# Patient Record
Sex: Female | Born: 1995 | Hispanic: Yes | Marital: Married | Smoking: Former smoker
Health system: Southern US, Community
[De-identification: ages and names within clinical notes are randomized; demographics above are authoritative.]

## PROBLEM LIST (undated history)

## (undated) DIAGNOSIS — E079 Disorder of thyroid, unspecified: Secondary | ICD-10-CM

## (undated) DIAGNOSIS — E785 Hyperlipidemia, unspecified: Secondary | ICD-10-CM

## (undated) DIAGNOSIS — J45909 Unspecified asthma, uncomplicated: Secondary | ICD-10-CM

## (undated) HISTORY — DX: Hyperlipidemia, unspecified: E78.5

## (undated) HISTORY — DX: Disorder of thyroid, unspecified: E07.9

## (undated) HISTORY — DX: Unspecified asthma, uncomplicated: J45.909

---

## 2021-02-01 DIAGNOSIS — J020 Streptococcal pharyngitis: Secondary | ICD-10-CM | POA: Diagnosis not present

## 2021-02-01 DIAGNOSIS — R112 Nausea with vomiting, unspecified: Secondary | ICD-10-CM | POA: Diagnosis not present

## 2021-02-01 DIAGNOSIS — R509 Fever, unspecified: Secondary | ICD-10-CM | POA: Diagnosis not present

## 2021-02-01 DIAGNOSIS — K591 Functional diarrhea: Secondary | ICD-10-CM | POA: Diagnosis not present

## 2021-03-22 ENCOUNTER — Telehealth: Payer: Self-pay | Admitting: Physician Assistant

## 2021-03-22 ENCOUNTER — Other Ambulatory Visit: Payer: Self-pay

## 2021-03-22 ENCOUNTER — Ambulatory Visit (INDEPENDENT_AMBULATORY_CARE_PROVIDER_SITE_OTHER): Payer: Medicaid Other | Admitting: Physician Assistant

## 2021-03-22 ENCOUNTER — Encounter: Payer: Self-pay | Admitting: Physician Assistant

## 2021-03-22 VITALS — BP 114/74 | HR 64 | Temp 97.6°F | Ht 64.0 in | Wt 185.0 lb

## 2021-03-22 DIAGNOSIS — Z7689 Persons encountering health services in other specified circumstances: Secondary | ICD-10-CM | POA: Diagnosis not present

## 2021-03-22 DIAGNOSIS — R194 Change in bowel habit: Secondary | ICD-10-CM | POA: Diagnosis not present

## 2021-03-22 DIAGNOSIS — Z1321 Encounter for screening for nutritional disorder: Secondary | ICD-10-CM

## 2021-03-22 DIAGNOSIS — Z23 Encounter for immunization: Secondary | ICD-10-CM | POA: Diagnosis not present

## 2021-03-22 DIAGNOSIS — R1084 Generalized abdominal pain: Secondary | ICD-10-CM | POA: Diagnosis not present

## 2021-03-22 DIAGNOSIS — R112 Nausea with vomiting, unspecified: Secondary | ICD-10-CM | POA: Diagnosis not present

## 2021-03-22 DIAGNOSIS — Z01419 Encounter for gynecological examination (general) (routine) without abnormal findings: Secondary | ICD-10-CM | POA: Diagnosis not present

## 2021-03-22 DIAGNOSIS — Z130 Encounter for screening for diseases of the blood and blood-forming organs and certain disorders involving the immune mechanism: Secondary | ICD-10-CM

## 2021-03-22 MED ORDER — DICYCLOMINE HCL 10 MG PO CAPS: 10.0000 mg | ORAL_CAPSULE | Freq: Three times a day (TID) | ORAL | 0 refills | Status: DC | PRN

## 2021-03-22 NOTE — Progress Notes (Signed)
New Patient Office Visit  Subjective:  Patient ID: Tracie Tapia, female    DOB: 06-06-1995  Age: 26 y.o. MRN: [id number]  CC:  Chief Complaint  Patient presents with   New Patient (Initial Visit)    HPI Tracie Tapia presents to establish care. Patient reports it has been about 5 years since she has been to a PCP. Patient has a past medical hx of hyperlipidemia and thyroid disease. States unsure about what type of thyroid disease. Does not take prescribed medications or supplements. Patient has c/o abdominal pain which radiates to her back x 2 years which has gradually worsened the past few months. No specific location for pain, states is all over. Pain comes and goes. Reports pain varies from 8-10 on a scale of 0-10. Sometimes feels bloated and also has nausea and vomiting which she describes as yellow which looks like bile. Denies constipation. Lately has been having more diarrhea with loose stools and decreased appetite. No fever,bloody stools, dysuria, hematuria, unintentional weight loss, chills or night sweats.  Past Medical History:  Diagnosis Date   Hyperlipidemia    Thyroid disease     Past Surgical History:  Procedure Laterality Date   CESAREAN SECTION  2017    Family History  Problem Relation Age of Onset   Cancer Mother        Ovarian, Breast   Hypertension Mother    Hyperlipidemia Mother    Diabetes Mother    Hypertension Father    Hyperlipidemia Father    Diabetes Father    Depression Maternal Grandmother    Hypertension Maternal Grandmother    Hyperlipidemia Maternal Grandmother    Diabetes Maternal Grandmother    Cancer Maternal Grandmother        Ovarian, Breast   Hypertension Maternal Grandfather    Hyperlipidemia Maternal Grandfather    Diabetes Maternal Grandfather    Hypertension Paternal Grandmother    Hyperlipidemia Paternal Grandmother    Diabetes Paternal Grandmother    Heart disease Paternal Grandmother    Hypertension  Paternal Grandfather    Hyperlipidemia Paternal Grandfather    Diabetes Paternal Grandfather     Social History   Socioeconomic History   Marital status: Married    Spouse name: Not on file   Number of children: 1   Years of education: Not on file   Highest education level: Not on file  Occupational History   Not on file  Tobacco Use   Smoking status: Former    Packs/day: 2.00    Years: 1.00    Pack years: 2.00    Types: Cigarettes    Quit date: 2020    Years since quitting: 3.0   Smokeless tobacco: Never  Substance and Sexual Activity   Alcohol use: Not Currently   Drug use: Not Currently   Sexual activity: Yes    Partners: Male  Other Topics Concern   Not on file  Social History Narrative   Not on file   Social Determinants of Health   Financial Resource Strain: Not on file  Food Insecurity: Not on file  Transportation Needs: Not on file  Physical Activity: Not on file  Stress: Not on file  Social Connections: Not on file  Intimate Partner Violence: Not on file    ROS Review of Systems Review of Systems:  A fourteen system review of systems was performed and found to be positive as per HPI.  Objective:   Today's Vitals: BP 114/74    Pulse 64  Temp 97.6 F (36.4 C)    Ht 5\' 4"  (1.626 m)    Wt 185 lb (83.9 kg)    SpO2 100%    BMI 31.76 kg/m   Physical Exam General:  Well Developed, well nourished, appropriate for stated age.  Neuro:  Alert and oriented,  extra-ocular muscles intact  HEENT:  Normocephalic, atraumatic, neck supple  Skin:  no gross rash, warm, pink. Abdomen: +BS, non-distended, +generalized tenderness with prominent tenderness of RUQ and LLQ, negative Rovsing's and McBurney's signs, no guarding or rebound tenderness, no bruit appreciated  Cardiac:  RRR, S1 S2 Respiratory: CTA B/L  Vascular:  Ext warm, no cyanosis apprec.; cap RF less 2 sec. Psych:  No HI/SI, judgement and insight good, Euthymic mood. Full Affect.  Assessment & Plan:    Problem List Items Addressed This Visit   None Visit Diagnoses     Encounter to establish care    -  Primary   Generalized abdominal pain       Relevant Medications   dicyclomine (BENTYL) 10 MG capsule   Nausea and vomiting, unspecified vomiting type       Relevant Orders   CT Abdomen Pelvis W Contrast   Need for influenza vaccination       Relevant Orders   Flu Vaccine QUAD 75mo+IM (Fluarix, Fluzone & Alfiuria Quad PF) (Completed)   Women's annual routine gynecological examination       Relevant Orders   Ambulatory referral to Obstetrics / Gynecology   Change in bowel habit          Encounter to establish care: -Will place referral to OB/GYN for routine female exam with gynecological exam per patient request. -Recommend to schedule CPE in the near future. -Will collect routine fasting labs tomorrow morning.  Generalized abdominal pain: -Etiology unclear. DDX: gallbladder disease, pancreatitis, colitis, gastroenteritis, IBD, diverticulitis, IBS. Will place order for abdomen pelvis CT for further evaluation. Will trial dicyclomine 10 mg TID prn. Pending imaging studies, recommend referral to gastroenterology. Patient prefers to obtain imaging before proceeding with referral. Patient returning tomorrow morning for FBW and will evaluate for leukocytosis, signs of pancreatitis or gallbladder or hematologic etiology. Will reassess symptoms and medication therapy in 1 week.  Outpatient Encounter Medications as of 03/22/2021  Medication Sig   dicyclomine (BENTYL) 10 MG capsule Take 1 capsule (10 mg total) by mouth 3 (three) times daily as needed for spasms.   No facility-administered encounter medications on file as of 03/22/2021.    Follow-up: Return in about 1 week (around 03/29/2021) for abd pain; lab visit for tomorrow monring FBW include lipase .   Lorrene Reid, PA-C

## 2021-03-22 NOTE — Telephone Encounter (Signed)
PA has been denied. 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 Please contact [facility] at [phone].  CPT 74177 DX R11.2

## 2021-03-22 NOTE — Patient Instructions (Addendum)
Dolor abdominal en los adultos Abdominal Pain, Adult El dolor de estmago (abdominal) puede tener muchas causas. La mayora de las veces, el dolor de estmago no es peligroso. Muchos de estos casos de dolor de estmago pueden controlarse y tratarse en casa. Sin embargo, a veces, el dolor de estmago es grave. Elmdico intentar descubrir la causa del dolor de estmago. Siga estas instrucciones en su casa:  Medicamentos Tome los medicamentos de venta libre y los recetados solamente como se lo haya indicado el mdico. No tome medicamentos que lo ayuden a defecar (laxantes), salvo que el mdico se lo indique. Instrucciones generales Est atento al dolor de estmago para detectar cualquier cambio. Beba suficiente lquido para mantener el pis (la orina) de color amarillo plido. Concurra a todas las visitas de seguimiento como se lo haya indicado el mdico. Esto es importante. Comunquese con un mdico si: El dolor de estmago cambia o empeora. No tiene apetito o baja de peso sin proponrselo. Tiene dificultades para defecar (est estreido) o heces lquidas (diarrea) durante ms de 2 o 3 das. Siente dolor al orinar o defecar. El dolor de estmago lo despierta de noche. El dolor empeora con las comidas, despus de comer o con determinados alimentos. Tiene vmitos y no puede retener nada de lo que ingiere. Tiene fiebre. Observa sangre en la orina. Solicite ayuda de inmediato si: El dolor no desaparece en el tiempo indicado por el mdico. No puede dejar de vomitar. Siente dolor solamente en zonas especficas del abdomen, como el lado derecho o la parte inferior izquierda. Tiene heces con sangre, de color negro o con aspecto alquitranado. Tiene dolor muy intenso en el vientre, clicos o meteorismo. Presenta signos de no tener suficientes lquidos o agua en el cuerpo (deshidratacin), por ejemplo: Orina oscura, muy escasa o falta de orina. Labios agrietados. Sequedad de boca. Ojos  hundidos. Somnolencia. Debilidad. Tiene dificultad para respirar o dolor en el pecho. Resumen Muchos de estos casos de dolor de estmago pueden controlarse y tratarse en casa. Est atento al dolor de estmago para detectar cualquier cambio. Tome los medicamentos de venta libre y los recetados solamente como se lo haya indicado el mdico. Comunquese con un mdico si el dolor de estmago cambia o empeora. Busque ayuda de inmediato si tiene dolor muy intenso en el vientre, clicos o meteorismo. Esta informacin no tiene como fin reemplazar el consejo del mdico. Asegresede hacerle al mdico cualquier pregunta que tenga. Document Revised: 08/14/2018 Document Reviewed: 08/14/2018 Elsevier Patient Education  2022 Elsevier Inc.  

## 2021-03-23 ENCOUNTER — Other Ambulatory Visit: Payer: Medicaid Other

## 2021-03-23 DIAGNOSIS — Z1329 Encounter for screening for other suspected endocrine disorder: Secondary | ICD-10-CM | POA: Diagnosis not present

## 2021-03-23 DIAGNOSIS — Z130 Encounter for screening for diseases of the blood and blood-forming organs and certain disorders involving the immune mechanism: Secondary | ICD-10-CM

## 2021-03-23 DIAGNOSIS — R112 Nausea with vomiting, unspecified: Secondary | ICD-10-CM

## 2021-03-23 DIAGNOSIS — Z13228 Encounter for screening for other metabolic disorders: Secondary | ICD-10-CM | POA: Diagnosis not present

## 2021-03-23 DIAGNOSIS — R1084 Generalized abdominal pain: Secondary | ICD-10-CM | POA: Diagnosis not present

## 2021-03-23 DIAGNOSIS — Z1321 Encounter for screening for nutritional disorder: Secondary | ICD-10-CM | POA: Diagnosis not present

## 2021-03-23 NOTE — Progress Notes (Signed)
Established Patient Office Visit  Subjective:  Patient ID: Tracie Tapia, female    DOB: [birth year]  Age: 26 y.o. MRN: [id number]  CC:  Chief Complaint  Patient presents with   Abdominal Pain    HPI [facility] presents for follow up on abdominal pain. Patient reports Bentyl has helped with the abdominal pain and states her nausea has also improved. Does states after having a meal will have a bowel movement. Patient c/o of generalized itching all over her body which happens suddenly. Denies new detergents, soaps, facial or tongue swelling, shortness of breath or wheezing. Also states has chronic back pain which can be triggered by prolonged sitting. No bowel or bladder dysfunction. Takes acetaminophen when needed for pain relief which is not always effective. 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 Patient reports remote hx of asthma and when needed uses saline solution for nebulizer treatment, tries to limit using albuterol. Has ventolin inhaler which she has not used recently.   Past Medical History:  Diagnosis Date   Hyperlipidemia    Thyroid disease     Past Surgical History:  Procedure Laterality Date   CESAREAN SECTION  2017    Family History  Problem Relation Age of Onset   Cancer Mother        Ovarian, Breast   Hypertension Mother    Hyperlipidemia Mother    Diabetes Mother    Hypertension Father    Hyperlipidemia Father    Diabetes Father    Depression Maternal Grandmother    Hypertension Maternal Grandmother    Hyperlipidemia Maternal Grandmother    Diabetes Maternal Grandmother    Cancer Maternal Grandmother        Ovarian, Breast   Hypertension Maternal Grandfather    Hyperlipidemia Maternal Grandfather    Diabetes Maternal Grandfather    Hypertension Paternal Grandmother    Hyperlipidemia Paternal Grandmother    Diabetes Paternal Grandmother    Heart disease Paternal Grandmother    Hypertension  Paternal Grandfather    Hyperlipidemia Paternal Grandfather    Diabetes Paternal Grandfather     Social History   Socioeconomic History   Marital status: Married    Spouse name: Not on file   Number of children: 1   Years of education: Not on file   Highest education level: Not on file  Occupational History   Not on file  Tobacco Use   Smoking status: Former    Packs/day: 2.00    Years: 1.00    Pack years: 2.00    Types: Cigarettes    Quit date: 2020    Years since quitting: 3.1   Smokeless tobacco: Never  Substance and Sexual Activity   Alcohol use: Not Currently   Drug use: Not Currently   Sexual activity: Yes    Partners: Male  Other Topics Concern   Not on file  Social History Narrative   Not on file   Social Determinants of Health   Financial Resource Strain: Not on file  Food Insecurity: Not on file  Transportation Needs: Not on file  Physical Activity: Not on file  Stress: Not on file  Social Connections: Not on file  Intimate Partner Violence: Not on file    Outpatient Medications Prior to Visit  Medication Sig Dispense Refill   dicyclomine (BENTYL) 10 MG capsule Take 1 capsule (10 mg total) by mouth 3 (three) times daily as needed for spasms. 30 capsule 0   No facility-administered  medications prior to visit.    No Known Allergies  ROS Review of Systems Review of Systems:  A fourteen system review of systems was performed and found to be positive as per HPI.    Objective:    Physical Exam General:  Well Developed, well nourished, in no acute distress  Neuro:  Alert and oriented,  extra-ocular muscles intact  HEENT:  Normocephalic, atraumatic, neck supple  Skin:  no rash, warm, pink. Cardiac:  RRR, S1 S2 Abdomen: +BS, soft, non-distended, mild tenderness of upper and lower abdomen, no rebound tenderness  Respiratory: CTA B/L w/o wheezing, rhonchi, or crackles.  MSK: tenderness of midline thoracic spine, muscle spasticity of mid-lower back  mostly right sided noted, no step-off or deformity, full ROM Vascular:  Ext warm, no cyanosis apprec.; cap RF less 2 sec. Psych:  No HI/SI, judgement and insight good, Euthymic mood. Full Affect.  BP 108/69    Pulse (!) 57    Temp 98 F (36.7 C)    Ht _0  (1.626 m)    Wt 182 lb (82.6 kg)    SpO2 98%    BMI 31.24 kg/m  Wt Readings from Last 3 Encounters:  03/29/21 182 lb (82.6 kg)  03/22/21 185 lb (83.9 kg)     Health Maintenance Due  Topic Date Due   COVID-19 Vaccine (1) Never done   HPV VACCINES (1 - 2-dose series) Never done   HIV Screening  Never done   Hepatitis C Screening  Never done   TETANUS/TDAP  Never done   PAP-Cervical Cytology Screening  Never done   PAP SMEAR-Modifier  Never done       Topic Date Due   HPV VACCINES (1 - 2-dose series) Never done    Lab Results  Component Value Date   TSH 2.070 03/23/2021   Lab Results  Component Value Date   WBC 7.8 03/23/2021   HGB 13.3 03/23/2021   HCT 40.5 03/23/2021   MCV 83 03/23/2021   PLT 356 03/23/2021   Lab Results  Component Value Date   NA 138 03/23/2021   K 4.5 03/23/2021   CO2 22 03/23/2021   GLUCOSE 85 03/23/2021   BUN 9 03/23/2021   CREATININE 0.73 03/23/2021   BILITOT 0.3 03/23/2021   ALKPHOS 86 03/23/2021   AST 23 03/23/2021   ALT 30 03/23/2021   PROT 7.4 03/23/2021   ALBUMIN 4.5 03/23/2021   CALCIUM 9.7 03/23/2021   EGFR 116 03/23/2021   Lab Results  Component Value Date   CHOL 267 (H) 03/23/2021   Lab Results  Component Value Date   HDL 49 03/23/2021   Lab Results  Component Value Date   LDLCALC 187 (H) 03/23/2021   Lab Results  Component Value Date   TRIG 165 (H) 03/23/2021   Lab Results  Component Value Date   CHOLHDL 5.4 (H) 03/23/2021   Lab Results  Component Value Date   HGBA1C 5.8 (H) 03/23/2021      Assessment & Plan:   Problem List Items Addressed This Visit   None Visit Diagnoses     Pruritus    -  Primary   Relevant Orders   Bile acids, total    Generalized abdominal pain       Chronic bilateral thoracic back pain       Relevant Medications   methocarbamol (ROBAXIN) 500 MG tablet   Mild intermittent asthma without complication       Relevant Medications   sodium chloride 0.9 %  nebulizer solution   albuterol (VENTOLIN HFA) 108 (90 Base) MCG/ACT inhaler      Generalized abdominal pain: -Improved. Patient is scheduled for abdomen pelvis CT w/ contrast 04/18/2021. -Recommend to continue dicyclomine 10 mg TID as needed. Pending CT results will determine next appropriate steps.  Pruritus: -Discussed with patient various etiologies. Will collect lab to evaluate for signs of cholestasis contributing to symptoms. Recent CBC, TSH and CMP normal. Advised patient to trial oral antihistamine such as Zytec and/or apply Sarna lotion.   Chronic bilateral thoracic back pain: -Discussed likely MSK etiology. Recommend to apply heat therapy, gentle back stretches and exercises, and can take muscle relaxer when needed for significant pain.   Mild intermittent asthma without complication: -Stable. No adventitious sounds on exam. -Use rescue inhaler when needed and nebulizer treatment.  Meds ordered this encounter  Medications   methocarbamol (ROBAXIN) 500 MG tablet    Sig: Take 1 tablet (500 mg total) by mouth every 8 (eight) hours as needed for muscle spasms.    Dispense:  30 tablet    Refill:  0    Order Specific Question:   Supervising Provider    Answer:   Beatrice Lecher D [[id number]]   sodium chloride 0.9 % nebulizer solution    Sig: Take 3 mLs by nebulization as needed for wheezing.    Dispense:  90 mL    Refill:  1    Order Specific Question:   Supervising Provider    Answer:   Beatrice Lecher D [[id number]]   albuterol (VENTOLIN HFA) 108 (90 Base) MCG/ACT inhaler    Sig: Inhale 2 puffs into the lungs every 6 (six) hours as needed for wheezing or shortness of breath.    Dispense:  8 g    Refill:  2    Order Specific Question:    Supervising Provider    Answer:   Beatrice Lecher D [[id number]]    Follow-up: Return in about 3 months (around 06/26/2021) for CPE with FBW few days before.    Lorrene Reid, PA-C

## 2021-03-24 LAB — COMPREHENSIVE METABOLIC PANEL
ALT: 30 IU/L (ref 0–32)
AST: 23 IU/L (ref 0–40)
Albumin/Globulin Ratio: 1.6 (ref 1.2–2.2)
Albumin: 4.5 g/dL (ref 3.9–5.0)
Alkaline Phosphatase: 86 IU/L (ref 44–121)
BUN/Creatinine Ratio: 12 (ref 9–23)
BUN: 9 mg/dL (ref 6–20)
Bilirubin Total: 0.3 mg/dL (ref 0.0–1.2)
CO2: 22 mmol/L (ref 20–29)
Calcium: 9.7 mg/dL (ref 8.7–10.2)
Chloride: 102 mmol/L (ref 96–106)
Creatinine, Ser: 0.73 mg/dL (ref 0.57–1.00)
Globulin, Total: 2.9 g/dL (ref 1.5–4.5)
Glucose: 85 mg/dL (ref 70–99)
Potassium: 4.5 mmol/L (ref 3.5–5.2)
Sodium: 138 mmol/L (ref 134–144)
Total Protein: 7.4 g/dL (ref 6.0–8.5)
eGFR: 116 mL/min/{1.73_m2} (ref >59)

## 2021-03-24 LAB — LIPID PANEL
Chol/HDL Ratio: 5.4 ratio — ABNORMAL HIGH (ref 0.0–4.4)
Cholesterol, Total: 267 mg/dL — ABNORMAL HIGH (ref 100–199)
HDL: 49 mg/dL (ref >39)
LDL Chol Calc (NIH): 187 mg/dL — ABNORMAL HIGH (ref 0–99)
Triglycerides: 165 mg/dL — ABNORMAL HIGH (ref 0–149)
VLDL Cholesterol Cal: 31 mg/dL (ref 5–40)

## 2021-03-24 LAB — CBC WITH DIFFERENTIAL/PLATELET
Basophils Absolute: 0.1 10*3/uL (ref 0.0–0.2)
Basos: 1 %
EOS (ABSOLUTE): 0.1 10*3/uL (ref 0.0–0.4)
Eos: 2 %
Hematocrit: 40.5 % (ref 34.0–46.6)
Hemoglobin: 13.3 g/dL (ref 11.1–15.9)
Immature Grans (Abs): 0.1 10*3/uL (ref 0.0–0.1)
Immature Granulocytes: 1 %
Lymphocytes Absolute: 2.3 10*3/uL (ref 0.7–3.1)
Lymphs: 30 %
MCH: 27.3 pg (ref 26.6–33.0)
MCHC: 32.8 g/dL (ref 31.5–35.7)
MCV: 83 fL (ref 79–97)
Monocytes Absolute: 0.5 10*3/uL (ref 0.1–0.9)
Monocytes: 6 %
Neutrophils Absolute: 4.8 10*3/uL (ref 1.4–7.0)
Neutrophils: 60 %
Platelets: 356 10*3/uL (ref 150–450)
RBC: 4.87 x10E6/uL (ref 3.77–5.28)
RDW: 13.1 % (ref 11.7–15.4)
WBC: 7.8 10*3/uL (ref 3.4–10.8)

## 2021-03-24 LAB — HEMOGLOBIN A1C
Est. average glucose Bld gHb Est-mCnc: 120 mg/dL
Hgb A1c MFr Bld: 5.8 % — ABNORMAL HIGH (ref 4.8–5.6)

## 2021-03-24 LAB — TSH: TSH: 2.07 u[IU]/mL (ref 0.450–4.500)

## 2021-03-24 LAB — LIPASE: Lipase: 19 U/L (ref 14–72)

## 2021-03-29 ENCOUNTER — Encounter: Payer: Self-pay | Admitting: Physician Assistant

## 2021-03-29 ENCOUNTER — Other Ambulatory Visit: Payer: Self-pay

## 2021-03-29 ENCOUNTER — Ambulatory Visit (INDEPENDENT_AMBULATORY_CARE_PROVIDER_SITE_OTHER): Payer: Medicaid Other | Admitting: Physician Assistant

## 2021-03-29 VITALS — BP 108/69 | HR 57 | Temp 98.0°F | Ht 64.0 in | Wt 182.0 lb

## 2021-03-29 DIAGNOSIS — R1084 Generalized abdominal pain: Secondary | ICD-10-CM

## 2021-03-29 DIAGNOSIS — J4520 Mild intermittent asthma, uncomplicated: Secondary | ICD-10-CM

## 2021-03-29 DIAGNOSIS — M546 Pain in thoracic spine: Secondary | ICD-10-CM | POA: Diagnosis not present

## 2021-03-29 DIAGNOSIS — L299 Pruritus, unspecified: Secondary | ICD-10-CM | POA: Diagnosis not present

## 2021-03-29 DIAGNOSIS — G8929 Other chronic pain: Secondary | ICD-10-CM | POA: Diagnosis not present

## 2021-03-29 MED ORDER — ALBUTEROL SULFATE HFA 108 (90 BASE) MCG/ACT IN AERS
2.0000 | INHALATION_SPRAY | Freq: Four times a day (QID) | RESPIRATORY_TRACT | 2 refills | Status: DC | PRN
Start: 2021-03-29 — End: 2021-05-16

## 2021-03-29 MED ORDER — SODIUM CHLORIDE 0.9 % IN NEBU: 3.0000 mL | INHALATION_SOLUTION | RESPIRATORY_TRACT | 1 refills | Status: DC | PRN

## 2021-03-29 MED ORDER — METHOCARBAMOL 500 MG PO TABS: 500.0000 mg | ORAL_TABLET | Freq: Three times a day (TID) | ORAL | 0 refills | Status: DC | PRN

## 2021-03-29 NOTE — Patient Instructions (Addendum)
Prurito Pruritus El prurito es una sensacin de picazn en la piel. Una de las causas ms frecuentes es la sequedad de la piel, pero muchas cosas diferentes pueden causar picazn. La mayora de las causas de picazn no requieren [place]. A veces, la picazn en la piel puede convertirse en una erupcin cutnea. Siga estas indicaciones en su casa: Cuidado de la piel  Aplquese una locin humectante sobre la piel segn sea necesario. Las lociones que contienen vaselina son las mejores. Tome los medicamentos o aplquese las cremas con medicamento solamente como se lo haya indicado el mdico. Esto puede incluir lo siguiente: Crema con corticosteroides. Lociones para Associate Professor. Antihistamnicos por va oral. Aplique un pao fro y hmedo (compresa fra) en la zona afectada. Tome baos de inmersin con uno de los siguientes: Airline pilot de [place]. Puede conseguirlas en la tienda de comestibles o la farmacia local. Siga las instrucciones del envase. Bicarbonato de sodio. Vierta un poco en la baera como se lo haya indicado el mdico. Avena coloidal. Puede conseguirla en la tienda de comestibles o la farmacia local. Siga las instrucciones del envase. Aplquese una pasta de bicarbonato de [employer]. Para prepararla, mezcle un poco de agua con una pequea cantidad de bicarbonato de sodio hasta alcanzar la consistencia de una pasta. No se rasque la piel. No tome baos de inmersin ni duchas calientes, ya que pueden empeorar la picazn. Shaune Spittle de agua fra puede ayudar a Associate Professor, siempre y cuando use un humectante despus de la ducha. No utilice jabones perfumados, detergentes, perfumes ni cosmticos. En lugar de eso, utilice versiones suaves y sin perfume de estos productos. Instrucciones generales Evite usar ropa ajustada. Lleve un diario como ayuda para determinar la causa de la picazn. Escriba los siguientes datos: Lo que usted come y [place]. Los cosmticos que  [place]. Qu detergentes o [employer]. Lo que lleva puesto, incluidas las joyas. Use un humidificador. Este [employer] humedad del aire, lo que ayuda a Chartered certified accountant. Preste atencin a cualquier Advertising account planner. Comunquese con un mdico si: La picazn no desaparece despus de [employer]. Tiene una sed inusual u orina ms de lo normal. Siente hormigueo o adormecimiento en la piel. Nota que la piel o la zona blanca de los ojos estn amarillas (ictericia). Se siente dbil. Tiene alguno de los siguientes sntomas: Sudoracin nocturna. Cansancio (fatiga). Prdida de peso. Dolor abdominal. Resumen El prurito es una sensacin de picazn en la piel. Una de las causas ms frecuentes es la sequedad de la piel, pero muchas afecciones y factores diferentes pueden causar picazn. Aplquese una locin humectante sobre la piel segn sea necesario. Las lociones que contienen vaselina son las mejores. Tome los medicamentos o aplquese las cremas con medicamento solamente como se lo haya indicado el mdico. No se duche ni tome baos de inmersin con agua caliente. No utilice jabones perfumados, detergentes, perfumes ni cosmticos. Esta informacin no tiene Theme park manager el consejo del mdico. Asegrese de hacerle al mdico cualquier pregunta que tenga. Document Revised: 04/18/2017 Document Reviewed: 04/18/2017 Elsevier Patient Education  2022 [employer].

## 2021-03-30 LAB — BILE ACIDS, TOTAL: Bile Acids Total: 1.6 umol/L (ref 0.0–10.0)

## 2021-04-18 ENCOUNTER — Ambulatory Visit
Admission: RE | Admit: 2021-04-18 | Discharge: 2021-04-18 | Disposition: A | Discharge status: Home / Self Care | Payer: Medicaid Other | Source: Ambulatory Visit | Attending: Physician Assistant | Admitting: Physician Assistant

## 2021-04-18 ENCOUNTER — Other Ambulatory Visit: Payer: Self-pay

## 2021-04-18 DIAGNOSIS — R1084 Generalized abdominal pain: Secondary | ICD-10-CM | POA: Diagnosis not present

## 2021-04-18 DIAGNOSIS — R112 Nausea with vomiting, unspecified: Secondary | ICD-10-CM | POA: Diagnosis not present

## 2021-04-18 MED ORDER — IOPAMIDOL (ISOVUE-300) INJECTION 61%
100.0000 mL | Freq: Once | INTRAVENOUS | Status: AC | PRN
  Administered 2021-04-18: 100 mL via INTRAVENOUS

## 2021-05-02 ENCOUNTER — Other Ambulatory Visit: Payer: Self-pay

## 2021-05-02 DIAGNOSIS — G8929 Other chronic pain: Secondary | ICD-10-CM

## 2021-05-02 DIAGNOSIS — R1084 Generalized abdominal pain: Secondary | ICD-10-CM

## 2021-05-02 MED ORDER — METHOCARBAMOL 500 MG PO TABS: 500.0000 mg | ORAL_TABLET | Freq: Three times a day (TID) | ORAL | 2 refills | Status: DC | PRN

## 2021-05-02 MED ORDER — DICYCLOMINE HCL 10 MG PO CAPS: 10.0000 mg | ORAL_CAPSULE | Freq: Three times a day (TID) | ORAL | 1 refills | Status: AC | PRN

## 2021-05-09 ENCOUNTER — Encounter: Payer: Self-pay | Admitting: Physician Assistant

## 2021-05-16 ENCOUNTER — Encounter: Payer: Self-pay | Admitting: Nurse Practitioner

## 2021-05-16 ENCOUNTER — Ambulatory Visit: Payer: Medicaid Other | Admitting: Nurse Practitioner

## 2021-05-16 ENCOUNTER — Other Ambulatory Visit: Payer: Medicaid Other

## 2021-05-16 VITALS — BP 102/60 | HR 61 | Ht 64.0 in | Wt 183.0 lb

## 2021-05-16 DIAGNOSIS — R197 Diarrhea, unspecified: Secondary | ICD-10-CM

## 2021-05-16 DIAGNOSIS — R1011 Right upper quadrant pain: Secondary | ICD-10-CM

## 2021-05-16 DIAGNOSIS — R112 Nausea with vomiting, unspecified: Secondary | ICD-10-CM | POA: Diagnosis not present

## 2021-05-16 NOTE — Progress Notes (Signed)
I agree with the above note, plan 

## 2021-05-16 NOTE — Patient Instructions (Signed)
Your provider has requested that you go to the basement level for lab work before leaving today. Press "B" on the elevator. The lab is located at the first door on the left as you exit the elevator. ? ? ?You have been scheduled for an abdominal ultrasound at [facility] (1st floor of hospital) on 05/20/21 at 9:00am. Please arrive 15 minutes prior to your appointment for registration. Make certain not to have anything to eat or drink 6 hours prior to your appointment. Should you need to reschedule your appointment, please contact radiology at [phone]. This test typically takes about 30 minutes to perform. ? ?We will call you with test result and what the next step will be.  ? ?Thank you for choosing me and [facility]. ? ?Tye Savoy NP  ? ?

## 2021-05-16 NOTE — Progress Notes (Signed)
? ? ?ASSESSMENT :   ? ?Patient profile:  ?Tracie Tapia is a 26 y.o. female  who speaks limited English and is new to practice. She is here with an interpreter. She has a past medical history significant for HLD, thyroid disease ( per patient but not on treatment),  obesity, DM ( diet controlled)  See PMH below for any additional history. ? ?# Two month history of progressive, nearly constant RUQ pain radiating through to back and up into right shoulder blade. Also with daily vomiting, abdominal bloating and multiple episodes of daily diarrhea.  CBC, CMET, lipase normal. CTAP w/ contrast is unremarkable. All of her symptoms are not necessarily related. Need to evaluate for gallbladder disease / PUD. Regarding diarrhea, rule out infection ( probably not), IBD. IBS- D possible.  ? ?PLAN:    ? ?RUQ [place] for RUQ pain / nausea and vomiting ?Stool GI path panel  ?Will contact her with results, if negative then likely proceed with EGD and colonoscopy.  ?Can continue Bentyl as needed but seems to be of only limited benefit   ? ?History of Present Illness:  ? ?Chief complaint:  abdominal pain, nausea, vomiting and diarrhea.  Abnormal CT scan ? ?Patient established care with PCP the end of January at which time she was having abdominal pain. She gives a history of burning and punching type RUQ pain since November. Pain radiates through to her back, into right shoulder blade. Pain can be exacerbated by bending / twisting or laying on right side. None of her symptoms are related to eating.   Pain initially intermittent ( 2-3 times a week).but constant for last two months. Pain wakes her up at night. Bentyl help a little.  Also having abdominal bloating, vomiting ( bilious emesis) and diarrhea for two months. Having 6-7 watery, non-bloody BMs a day. No solid stools since onset of symptoms 2 months ago. She bruises easy and noticed bruising or upper abdomen / RUQ areas where she has been having the pain.  he has  unintentionally lost 5 pounds. No antibiotics in last few months. No diet changes to attribute symptoms to. PCP advised her to reduce starch intake and sugars to help with diabetes but she hasn't seen any improvement in GI symptoms. Right now she is still managing diabetes by diet. Hgb A1c < 6 ? ?Has Robaxin on med list, says it was prescribed for the right back pain . It helps her sleep through the pain ? ?Weight 185 pounds 03/23/19, 183 pounds today ? ?No known [place] of GI disease.  ? ?Previous Labs / Imaging:: ? ?  Latest Ref Rng & Units 03/23/2021  ?  9:09 AM  ?CBC  ?WBC 3.4 - 10.8 x10E3/uL 7.8    ?Hemoglobin 11.1 - 15.9 g/dL 13.3    ?Hematocrit 34.0 - 46.6 % 40.5    ?Platelets 150 - 450 x10E3/uL 356    ? ? ?Lab Results  ?Component Value Date  ? LIPASE 19 03/23/2021  ? ? ?  Latest Ref Rng & Units 03/23/2021  ?  9:09 AM  ?CMP  ?Glucose 70 - 99 mg/dL 85    ?BUN 6 - 20 mg/dL 9    ?Creatinine 0.57 - 1.00 mg/dL 0.73    ?Sodium 134 - 144 mmol/L 138    ?Potassium 3.5 - 5.2 mmol/L 4.5    ?Chloride 96 - 106 mmol/L 102    ?CO2 20 - 29 mmol/L 22    ?Calcium 8.7 - 10.2 mg/dL 9.7    ?  Total Protein 6.0 - 8.5 g/dL 7.4    ?Total Bilirubin 0.0 - 1.2 mg/dL 0.3    ?Alkaline Phos 44 - 121 IU/L 86    ?AST 0 - 40 IU/L 23    ?ALT 0 - 32 IU/L 30    ? ? ?Previous GI Evaluations;   ? ?Endoscopies: ?none ? ? ?Imaging:  ?CT Abdomen Pelvis W Contrast ?CLINICAL DATA:  Nausea/vomiting generalized abdominal pain ? ?EXAM: ?CT ABDOMEN AND PELVIS WITH CONTRAST ? ?TECHNIQUE: ?Multidetector CT imaging of the abdomen and pelvis was performed ?using the standard protocol following bolus administration of ?intravenous contrast. ? ?RADIATION DOSE REDUCTION: This exam was performed according to the ?departmental dose-optimization program which includes automated ?exposure control, adjustment of the mA and/or kV according to ?patient size and/or use of iterative reconstruction technique. ? ?CONTRAST:  174mL ISOVUE-300 IOPAMIDOL (ISOVUE-300) INJECTION  61% ? ?COMPARISON:  None. ? ?FINDINGS: ?Lower chest: Lung bases are clear. No effusions. Heart is normal ?size. ? ?Hepatobiliary: No focal hepatic abnormality. Gallbladder ?unremarkable. ? ?Pancreas: No focal abnormality or ductal dilatation. ? ?Spleen: No focal abnormality.  Normal size. ? ?Adrenals/Urinary Tract: No adrenal abnormality. No focal renal ?abnormality. No stones or hydronephrosis. Urinary bladder is ?unremarkable. ? ?Stomach/Bowel: Normal appendix. Stomach, large and small bowel ?grossly unremarkable. ? ?Vascular/Lymphatic: No evidence of aneurysm or adenopathy. ? ?Reproductive: Uterus and adnexa unremarkable.  No mass. ? ?Other: No free fluid or free air. ? ?Musculoskeletal: No acute bony abnormality. ? ?IMPRESSION: ?No acute findings in the abdomen or pelvis. ? ?Electronically Signed ?  By: Rolm Baptise M.D. ?  On: 04/20/2021 00:02 ? ? ? ? ?Past Medical History:  ?Diagnosis Date  ? Hyperlipidemia   ? Thyroid disease   ? ?Past Surgical History:  ?Procedure Laterality Date  ? CESAREAN SECTION  2017  ? ?Family History  ?Problem Relation Age of Onset  ? Cancer Mother   ?     Ovarian, Breast  ? Hypertension Mother   ? Hyperlipidemia Mother   ? Diabetes Mother   ? Hypertension Father   ? Hyperlipidemia Father   ? Diabetes Father   ? Depression Maternal Grandmother   ? Hypertension Maternal Grandmother   ? Hyperlipidemia Maternal Grandmother   ? Diabetes Maternal Grandmother   ? Cancer Maternal Grandmother   ?     Ovarian, Breast  ? Hypertension Maternal Grandfather   ? Hyperlipidemia Maternal Grandfather   ? Diabetes Maternal Grandfather   ? Hypertension Paternal Grandmother   ? Hyperlipidemia Paternal Grandmother   ? Diabetes Paternal Grandmother   ? Heart disease Paternal Grandmother   ? Hypertension Paternal Grandfather   ? Hyperlipidemia Paternal Grandfather   ? Diabetes Paternal Grandfather   ? ?Social History  ? ?Tobacco Use  ? Smoking status: Former  ?  Packs/day: 2.00  ?  Years: 1.00  ?  Pack  years: 2.00  ?  Types: Cigarettes  ?  Quit date: 2020  ?  Years since quitting: 3.2  ? Smokeless tobacco: Never  ?Vaping Use  ? Vaping Use: Never used  ?Substance Use Topics  ? Alcohol use: Not Currently  ? Drug use: Not Currently  ? ?Current Outpatient Medications  ?Medication Sig Dispense Refill  ? albuterol (VENTOLIN HFA) 108 (90 Base) MCG/ACT inhaler Inhale 2 puffs into the lungs every 6 (six) hours as needed for wheezing or shortness of breath. 8 g 2  ? dicyclomine (BENTYL) 10 MG capsule Take 1 capsule (10 mg total) by mouth 3 (three) times daily  as needed for spasms. 90 capsule 1  ? methocarbamol (ROBAXIN) 500 MG tablet Take 1 tablet (500 mg total) by mouth every 8 (eight) hours as needed for muscle spasms. 30 tablet 2  ? sodium chloride 0.9 % nebulizer solution Take 3 mLs by nebulization as needed for wheezing. 90 mL 1  ? ?No current facility-administered medications for this visit.  ? ?No Known Allergies ? ? ?Review of Systems: ?Positive for easy bruisibility  All other systems reviewed and negative except where noted in HPI.  ? ?Physical Exam:   ? ?Wt Readings from Last 3 Encounters:  ?05/16/21 183 lb (83 kg)  ?03/29/21 182 lb (82.6 kg)  ?03/22/21 185 lb (83.9 kg)  ? ? ?Ht 5\' 4"  (1.626 m)   Wt 183 lb (83 kg)   BMI 31.41 kg/m?  ?Constitutional:  Generally well appearing female in no acute distress. ?Psychiatric: Pleasant. Normal mood and affect. Behavior is normal. ?EENT: Pupils normal.  Conjunctivae are normal. No scleral icterus. ?Neck supple.  ?Cardiovascular: Normal rate, regular rhythm. No edema ?Pulmonary/chest: Effort normal and breath sounds normal. No wheezing, rales or rhonchi. ?Abdominal: Soft, nondistended, nontender. Bowel sounds active throughout. There are no masses palpable. No hepatomegaly. ?Neurological: Alert and oriented to person place and time. ?Skin: Skin is warm and dry. No rashes noted. ? ?Tye Savoy, NP  05/16/2021, 11:02 AM ? ?Cc:  ?Referring Provider ?Lorrene Reid,  PA-C ? ? ? ? ? ? ? ?

## 2021-05-18 ENCOUNTER — Other Ambulatory Visit (HOSPITAL_COMMUNITY)
Admission: RE | Admit: 2021-05-18 | Discharge: 2021-05-18 | Disposition: A | Discharge status: Home / Self Care | Payer: Medicaid Other | Source: Ambulatory Visit | Attending: Obstetrics and Gynecology | Admitting: Obstetrics and Gynecology

## 2021-05-18 ENCOUNTER — Ambulatory Visit (INDEPENDENT_AMBULATORY_CARE_PROVIDER_SITE_OTHER): Payer: Medicaid Other | Admitting: Obstetrics and Gynecology

## 2021-05-18 ENCOUNTER — Other Ambulatory Visit: Payer: Self-pay

## 2021-05-18 ENCOUNTER — Encounter: Payer: Self-pay | Admitting: Obstetrics and Gynecology

## 2021-05-18 VITALS — BP 115/73 | HR 58 | Ht 64.0 in | Wt 183.0 lb

## 2021-05-18 DIAGNOSIS — Z01419 Encounter for gynecological examination (general) (routine) without abnormal findings: Secondary | ICD-10-CM

## 2021-05-18 DIAGNOSIS — Z113 Encounter for screening for infections with a predominantly sexual mode of transmission: Secondary | ICD-10-CM | POA: Diagnosis not present

## 2021-05-18 DIAGNOSIS — N926 Irregular menstruation, unspecified: Secondary | ICD-10-CM | POA: Diagnosis not present

## 2021-05-18 LAB — GI PROFILE, STOOL, PCR

## 2021-05-18 NOTE — Progress Notes (Signed)
? ? ?GYNECOLOGY ANNUAL PREVENTATIVE CARE ENCOUNTER NOTE ? ?History:    ? Addelynn Batte is a 26 y.o. [id number] female here for a routine annual gynecologic exam.  Current complaints: irregular menses.  Pt notes she has never had regular menses.  Menarche at age 46.  She has never tried OCPs for cycle control.  The patient has had one successful pregnancy with delivery by cesarean section.  Menses are irregular and they last 10-15 days.  She will also occasionally miss 2-3 months of menses. ?  ?Gynecologic History ?Patient's last menstrual period was 05/14/2021 (exact date). ?Contraception: none ?Last Pap: unsure.  ? ?Obstetric History ?OB History  ?Gravida Para Term Preterm AB Living  ?2 1     1 1   ?SAB IAB Ectopic Multiple Live Births  ?           ?  ?# Outcome Date GA Lbr Len/2nd Weight Sex Delivery Anes PTL Lv  ?2 Para           ?1 AB           ? ? ?Past Medical History:  ?Diagnosis Date  ? Hyperlipidemia   ? Thyroid disease   ? ? ?Past Surgical History:  ?Procedure Laterality Date  ? CESAREAN SECTION  2017  ? ? ?Current Outpatient Medications on File Prior to Visit  ?Medication Sig Dispense Refill  ? dicyclomine (BENTYL) 10 MG capsule Take 1 capsule (10 mg total) by mouth 3 (three) times daily as needed for spasms. 90 capsule 1  ? methocarbamol (ROBAXIN) 500 MG tablet Take 1 tablet (500 mg total) by mouth every 8 (eight) hours as needed for muscle spasms. 30 tablet 2  ? ?No current facility-administered medications on file prior to visit.  ? ? ?No Known Allergies ? ?Social History:  reports that she quit smoking about 3 years ago. Her smoking use included cigarettes. She has a 2.00 pack-year smoking history. She has never used smokeless tobacco. She reports that she does not currently use alcohol. She reports that she does not use drugs. ? ?Family History  ?Problem Relation Age of Onset  ? Breast cancer Mother   ? Cancer Mother   ?     Ovarian, Breast  ? Hypertension Mother   ? Hyperlipidemia Mother   ?  Diabetes Mother   ? Liver disease Father   ? Hypertension Father   ? Hyperlipidemia Father   ? Diabetes Father   ? Depression Maternal Grandmother   ? Hypertension Maternal Grandmother   ? Hyperlipidemia Maternal Grandmother   ? Diabetes Maternal Grandmother   ? Cancer Maternal Grandmother   ?     Ovarian, Breast  ? Colon cancer Maternal Grandfather   ? Hypertension Maternal Grandfather   ? Hyperlipidemia Maternal Grandfather   ? Diabetes Maternal Grandfather   ? Hypertension Paternal Grandmother   ? Hyperlipidemia Paternal Grandmother   ? Diabetes Paternal Grandmother   ? Heart disease Paternal Grandmother   ? Liver disease Paternal Grandfather   ? Hypertension Paternal Grandfather   ? Hyperlipidemia Paternal Grandfather   ? Diabetes Paternal Grandfather   ? ? ?The following portions of the patient's history were reviewed and updated as appropriate: allergies, current medications, past family history, past medical history, past social history, past surgical history and problem list. ? ?Review of Systems ?Pertinent items noted in HPI and remainder of comprehensive ROS otherwise negative. ? ?Physical Exam:  ?BP 115/73   Pulse (!) 58   Ht 5\' 4"  (1.626 m)  Wt 183 lb (83 kg)   LMP 05/14/2021 (Exact Date)   BMI 31.41 kg/m?  ?CONSTITUTIONAL: Well-developed, well-nourished female in no acute distress.  ?HENT:  Normocephalic, atraumatic, External right and left ear normal. Oropharynx is clear and moist ?EYES: Conjunctivae and EOM are normal.  ?NECK: Normal range of motion, supple, no masses.  Normal thyroid.  ?SKIN: Skin is warm and dry. No rash noted. Not diaphoretic. No erythema. No pallor.  No acanthosis nigricans.  No abnormal hair growth. ?MUSCULOSKELETAL: Normal range of motion. No tenderness.  No cyanosis, clubbing, or edema.  2+ distal pulses. ?NEUROLOGIC: Alert and oriented to person, place, and time. Normal reflexes, muscle tone coordination.  ?PSYCHIATRIC: Normal mood and affect. Normal behavior. Normal  judgment and thought content. ?CARDIOVASCULAR: Normal heart rate noted, regular rhythm ?RESPIRATORY: Clear to auscultation bilaterally. Effort and breath sounds normal, no problems with respiration noted. ?BREASTS: deferred ?ABDOMEN: Soft, no distention noted.  No tenderness, rebound or guarding.  ?PELVIC: Normal appearing external genitalia and urethral meatus; normal appearing vaginal mucosa and cervix.  No abnormal discharge noted.  Pap smear obtained.  Normal uterine size, no other palpable masses, no uterine or adnexal tenderness.  Performed in the presence of a chaperone. ?  ?Assessment and Plan:  ?  1. Routine screening for STI (sexually transmitted infection) ? ?- HepB+HepC+HIV Panel ?- RPR ?- Cervicovaginal ancillary only( [place]) ? ?2. Women's annual routine gynecological examination ?Normal annual exam ? ?- Cytology - PAP( [place]) ? ?3. Irregular menses ?Pt very suspicious for PCOS.  Pt advised to lose 10 % body weight. ?Discussed OCP for cycle control versus metformin therapy to aid in ovulation/menses.  Pt desires trial of metformin. ?Will get results of fasting insulin before starting. ?- Insulin, random ? ?Will follow up results of pap smear and manage accordingly. ?F/u in 1 month to discuss plan and checl weight loss ?Routine preventative health maintenance measures emphasized. ?Please refer to After Visit Summary for other counseling recommendations.  ?   ? ?Mariel Aloe, MD, FACOG ?Obstetrician Heritage manager, Faculty Practice ?Center for [employer], [facility]  ?

## 2021-05-18 NOTE — Progress Notes (Signed)
26 y.o. New GYN presents for Irregular periods, she miss 4-5 months each time. C/o heavy, painful periods, lasting 10-12 days. ?

## 2021-05-19 ENCOUNTER — Other Ambulatory Visit: Payer: Self-pay

## 2021-05-19 ENCOUNTER — Telehealth: Payer: Self-pay

## 2021-05-19 DIAGNOSIS — B9689 Other specified bacterial agents as the cause of diseases classified elsewhere: Secondary | ICD-10-CM

## 2021-05-19 LAB — CERVICOVAGINAL ANCILLARY ONLY
Bacterial Vaginitis (gardnerella): POSITIVE — AB
Candida Glabrata: NEGATIVE
Candida Vaginitis: NEGATIVE
Chlamydia: NEGATIVE
Comment: NEGATIVE
Comment: NEGATIVE
Comment: NEGATIVE
Comment: NEGATIVE
Comment: NEGATIVE
Comment: NORMAL
Neisseria Gonorrhea: NEGATIVE
Trichomonas: NEGATIVE

## 2021-05-19 MED ORDER — METRONIDAZOLE 500 MG PO TABS: 500.0000 mg | ORAL_TABLET | Freq: Two times a day (BID) | ORAL | 0 refills | Status: DC

## 2021-05-19 NOTE — Telephone Encounter (Signed)
[facility] called with the following alert: ? ?Sapovirus Not Detected Detected Abnormal    ?  ?Routing this message to Willette Cluster, NP for her to review and address. Will await her response. ?

## 2021-05-20 ENCOUNTER — Ambulatory Visit (HOSPITAL_COMMUNITY)
Admission: RE | Admit: 2021-05-20 | Discharge: 2021-05-20 | Disposition: A | Discharge status: Home / Self Care | Payer: Medicaid Other | Source: Ambulatory Visit | Attending: Nurse Practitioner | Admitting: Nurse Practitioner

## 2021-05-20 DIAGNOSIS — R1011 Right upper quadrant pain: Secondary | ICD-10-CM | POA: Insufficient documentation

## 2021-05-20 LAB — CYTOLOGY - PAP
Comment: NEGATIVE
Diagnosis: UNDETERMINED — AB
High risk HPV: NEGATIVE

## 2021-05-20 LAB — HEPB+HEPC+HIV PANEL
HIV Screen 4th Generation wRfx: NONREACTIVE
Hep B C IgM: NEGATIVE
Hep B Core Total Ab: NEGATIVE
Hep B E Ab: NEGATIVE
Hep B E Ag: NEGATIVE
Hep B Surface Ab, Qual: REACTIVE
Hep C Virus Ab: NONREACTIVE
Hepatitis B Surface Ag: NEGATIVE

## 2021-05-20 LAB — RPR: RPR Ser Ql: NONREACTIVE

## 2021-05-20 LAB — INSULIN, RANDOM: INSULIN: 11.9 u[IU]/mL (ref 2.6–24.9)

## 2021-05-20 NOTE — Telephone Encounter (Signed)
Using [employer], called pt to inform about results and recommendations as reviewed and documented by Willette Cluster, NP. Verbalized acceptance and understanding. ?

## 2021-05-24 ENCOUNTER — Telehealth: Payer: Self-pay

## 2021-05-24 NOTE — Telephone Encounter (Signed)
-----   Message from Meredith Pel, NP sent at 05/23/2021  4:59 PM EDT ----- ?Beth, please see how she is doing. Gi path panel + sapovirus but her symptoms having been ongoing for a while so I think that was coincidental, or maybe not. Please see which specific symptoms if any are ongoing. If doing better then would like to see in office in a few weeks. If not doing better at all then please let me know. Thanks ?

## 2021-05-24 NOTE — Telephone Encounter (Signed)
Patient contacted using interpreter Serina Cowper. ?The patient reports diarrhea 6 to 7 times a day. Reports vomiting 2 to 3 times a day. She had stopped all carbs and was eating only fish. She was started on Flagyl 500 mg BID by Dr Donavan Foil for BV (she says to re-balance her pH.) ?Advised she drink liquids tonight and advance to starchy foods like potatoes, rice, pasta. Take the Imodium.  ?Please advise. ?

## 2021-05-26 ENCOUNTER — Other Ambulatory Visit: Payer: Self-pay

## 2021-05-26 DIAGNOSIS — R112 Nausea with vomiting, unspecified: Secondary | ICD-10-CM

## 2021-05-26 DIAGNOSIS — R1011 Right upper quadrant pain: Secondary | ICD-10-CM

## 2021-05-26 DIAGNOSIS — R197 Diarrhea, unspecified: Secondary | ICD-10-CM

## 2021-05-26 MED ORDER — DIPHENOXYLATE-ATROPINE 2.5-0.025 MG PO TABS: ORAL_TABLET | ORAL | 0 refills | Status: AC

## 2021-05-26 MED ORDER — ONDANSETRON HCL 4 MG PO TABS: 4.0000 mg | ORAL_TABLET | Freq: Three times a day (TID) | ORAL | 0 refills | Status: AC | PRN

## 2021-05-26 NOTE — Telephone Encounter (Signed)
Tracie Pel, NP  You 1 hour ago (10:33 AM)  ? ?Thanks for update. Okay, the N/V and diarrhea have been ongoing for many weeks. CT scan, [place] unrevealing.  If she is open to EGD and colonoscopy at [facility] then I think that is next step. Please see if we can get this done in a couple of weeks. Make sure she has Zofran 4 mg to take Q 8 hours prn. If not then can call in #30. She can take the imodium but hold for a few days prior to colonoscopy.  Also, please give her Reglan 10 mg #2 to take 1 hour prior to each dose of bowel prep ( if doing the spilt prep). Thanks   ? ?

## 2021-05-26 NOTE — Telephone Encounter (Signed)
With the help of interpreter Angela Nevin [phone] from [facility] [employer] spoke with the patient. ?She is advised of the recommended plan of care. Patient agrees to this. ?EGD and colonoscopy scheduled for 06/14/21 in the [place] with Dr Bryan Lemma at 3:00 pm. She will continue all her medications as prescribed. Instructed to stop Imodium or Lomotil on 06/11/21 in preparation for the procedures. She is unable to come to the office to pick up written instructions. Confirmed she can use My Chart. She will watch for instructions in Spanish to appear under the Letters tab of My Chart.  ?Zofran and Lomotil prescriptions to [place] after confirmation of her pharmacy.  ?Agreed with we speak again [id number] to be certain she does not have any questions about her procedure instructions or her procedures. At that time the Reglan and her prep will need to be sent to her pharmacy. ?She will contact us if she were to acutely worsen.  ?

## 2021-05-30 ENCOUNTER — Encounter: Payer: Self-pay | Admitting: Gastroenterology

## 2021-05-30 ENCOUNTER — Other Ambulatory Visit: Payer: Self-pay

## 2021-05-30 MED ORDER — PEG-KCL-NACL-NASULF-NA ASC-C 100 G PO SOLR: 1.0000 | Freq: Once | ORAL | 0 refills | Status: AC

## 2021-05-30 MED ORDER — METOCLOPRAMIDE HCL 10 MG PO TABS: ORAL_TABLET | ORAL | 0 refills | Status: AC

## 2021-05-31 ENCOUNTER — Encounter: Payer: Medicaid Other | Admitting: Internal Medicine

## 2021-06-07 ENCOUNTER — Encounter: Payer: Self-pay | Admitting: Gastroenterology

## 2021-06-14 ENCOUNTER — Ambulatory Visit (AMBULATORY_SURGERY_CENTER): Payer: Medicaid Other | Admitting: Gastroenterology

## 2021-06-14 ENCOUNTER — Encounter: Payer: Self-pay | Admitting: Gastroenterology

## 2021-06-14 VITALS — BP 102/57 | HR 62 | Temp 98.0°F | Resp 13 | Ht 64.0 in | Wt 183.0 lb

## 2021-06-14 DIAGNOSIS — R1011 Right upper quadrant pain: Secondary | ICD-10-CM

## 2021-06-14 DIAGNOSIS — R194 Change in bowel habit: Secondary | ICD-10-CM

## 2021-06-14 DIAGNOSIS — K2100 Gastro-esophageal reflux disease with esophagitis, without bleeding: Secondary | ICD-10-CM

## 2021-06-14 DIAGNOSIS — R197 Diarrhea, unspecified: Secondary | ICD-10-CM | POA: Diagnosis not present

## 2021-06-14 DIAGNOSIS — K2970 Gastritis, unspecified, without bleeding: Secondary | ICD-10-CM

## 2021-06-14 DIAGNOSIS — K317 Polyp of stomach and duodenum: Secondary | ICD-10-CM | POA: Diagnosis not present

## 2021-06-14 DIAGNOSIS — R112 Nausea with vomiting, unspecified: Secondary | ICD-10-CM | POA: Diagnosis not present

## 2021-06-14 MED ORDER — SODIUM CHLORIDE 0.9 % IV SOLN: 500.0000 mL | Freq: Once | INTRAVENOUS | Status: DC

## 2021-06-14 NOTE — Op Note (Signed)
[facility] ?Patient Name: Tracie Tapia ?Procedure Date: 06/14/2021 2:48 PM ?MRN: [id number] ?Endoscopist: Doristine Locks , MD ?Age: 26 ?Referring MD:  ?Date of Birth: [birth year] ?Gender: Female ?Account #: [id number] ?Procedure:                Colonoscopy ?Indications:              Abdominal pain in the right upper quadrant, Change  ?                          in bowel habits, Diarrhea ?Medicines:                Monitored Anesthesia Care ?Procedure:                Pre-Anesthesia Assessment: ?                          - Prior to the procedure, a History and Physical  ?                          was performed, and patient medications and  ?                          allergies were reviewed. The patient's tolerance of  ?                          previous anesthesia was also reviewed. The risks  ?                          and benefits of the procedure and the sedation  ?                          options and risks were discussed with the patient.  ?                          All questions were answered, and informed consent  ?                          was obtained via interpreter. Prior Anticoagulants:  ?                          The patient has taken no previous anticoagulant or  ?                          antiplatelet agents. ASA Grade Assessment: II - A  ?                          patient with mild systemic disease. After reviewing  ?                          the risks and benefits, the patient was deemed in  ?                          satisfactory condition to undergo the procedure. ?  After obtaining informed consent, the colonoscope  ?                          was passed under direct vision. Throughout the  ?                          procedure, the patient's blood pressure, pulse, and  ?                          oxygen saturations were monitored continuously. The  ?                          Colonoscope was introduced through the anus and  ?                          advanced  to the the terminal ileum. The colonoscopy  ?                          was performed without difficulty. The patient  ?                          tolerated the procedure well. The quality of the  ?                          bowel preparation was good. The terminal ileum,  ?                          ileocecal valve, appendiceal orifice, and rectum  ?                          were photographed. ?Scope In: 3:03:53 PM ?Scope Out: 3:14:48 PM ?Scope Withdrawal Time: 0 hours 9 minutes 12 seconds  ?Total Procedure Duration: 0 hours 10 minutes 55 seconds  ?Findings:                 The perianal and digital rectal examinations were  ?                          normal. ?                          The entire colon appeared normal. Biopsies for  ?                          histology were taken with a cold forceps from the  ?                          right colon and left colon for evaluation of  ?                          microscopic colitis. Estimated blood loss was  ?                          minimal. ?  The retroflexed view of the distal rectum and anal  ?                          verge was normal and showed no anal or rectal  ?                          abnormalities. ?                          The terminal ileum appeared normal. ?Complications:            No immediate complications. ?Estimated Blood Loss:     Estimated blood loss was minimal. ?Impression:               - The entire examined colon is normal. Biopsied. ?                          - The distal rectum and anal verge are normal on  ?                          retroflexion view. ?                          - The examined portion of the ileum was normal. ?Recommendation:           - Patient has a contact number available for  ?                          emergencies. The signs and symptoms of potential  ?                          delayed complications were discussed with the  ?                          patient. Return to normal activities tomorrow.  ?                           Written discharge instructions were provided to the  ?                          patient. ?                          - Resume previous diet. ?                          - Continue present medications. ?                          - Await pathology results. ?                          - If pathology results from this study and the  ?                          upper endoscopy are unrevealing, and if symptoms  ?  persist, recommend HIDA scan for further evaluation. ?                          - Repeat colonoscopy at age 39 for screening  ?                          purposes. ?                          - Follow-up with Willette Cluster in the GI office  ?                          PRN. ?Doristine Locks, MD ?06/14/2021 3:25:06 PM ?

## 2021-06-14 NOTE — Patient Instructions (Addendum)
USTED TUVO UN PROCEDIMIENTO ENDOSCPICO HOY EN EL [facility]:   Lea el informe del procedimiento que se le entreg para cualquier pregunta especfica sobre lo que se encontr durante su examen.  Si el informe del examen no responde a sus preguntas, por favor llame a su gastroenterlogo para aclararlo.  Si usted solicit que no se le den detalles de lo que se encontr en su procedimiento al acompaante que le va a cuidar, entonces el informe del procedimiento se ha incluido en un sobre sellado para que usted lo revise despus cuando le sea ms conveniente.   LO QUE PUEDE ESPERAR: Algunas sensaciones de hinchazn en el abdomen.  Puede tener ms gases de lo normal.  El caminar puede ayudarle a eliminar el aire que se le puso en el tracto gastrointestinal durante el procedimiento y reducir la hinchazn.  Si le hicieron una endoscopia inferior (como una colonoscopia o una sigmoidoscopia flexible), podra notar manchas de sangre en las heces fecales o en el papel higinico.  Si se someti a una preparacin intestinal para su procedimiento, es posible que no tenga una evacuacin intestinal normal durante algunos das.   Tenga en cuenta:  Es posible que note un poco de irritacin y congestin en la nariz o algn drenaje.  Esto es debido al oxgeno utilizado durante su procedimiento.  No hay que preocuparse y esto debe desaparecer ms o menos en un da.   SNTOMAS PARA REPORTAR INMEDIATAMENTE:  Despus de una endoscopia inferior (colonoscopia o sigmoidoscopia flexible):  Cantidades excesivas de sangre en las heces fecales  Sensibilidad significativa o empeoramiento de los dolores abdominales   Hinchazn aguda del abdomen que antes no tena   Fiebre de 100F o ms   Despus de la endoscopia superior (EGD)  Vmitos de sangre o material como caf molido   Dolor en el pecho o dolor debajo de los omplatos que antes no tena   Dolor o dificultad persistente para tragar  Falta de aire que antes no  tena   Fiebre de 100F o ms  Heces fecales negras y pegajosas   Para asuntos urgentes o de emergencia, puede comunicarse con un gastroenterlogo a cualquier hora llamando al [phone].  DIETA:  Recomendamos una comida pequea al principio, pero luego puede continuar con su dieta normal.  Tome muchos lquidos, pero debe evitar las bebidas alcohlicas durante 24 horas.    ACTIVIDAD:  Debe planear tomarse las cosas con calma por el resto del da y no debe CONDUCIR ni usar maquinaria pesada hasta maana (debido a los medicamentos de sedacin utilizados durante el examen).     SEGUIMIENTO: Nuestro personal llamar al nmero que aparece en su historial al siguiente da hbil de su procedimiento para ver cmo se siente y para responder cualquier pregunta o inquietud que pueda tener con respecto a la informacin que se le dio despus del procedimiento. Si no podemos contactarle, le dejaremos un mensaje.  Sin embargo, si se siente bien y no tiene ningn problema, no es necesario que nos devuelva la llamada.  Asumiremos que ha regresado a sus actividades diarias normales sin incidentes. Si se le tomaron algunas biopsias, le contactaremos por telfono o por carta en las prximas 3 semanas.  Si no ha sabido nada sobre las biopsias en el transcurso de 3 semanas, por favor llmenos al [phone].   FIRMAS/CONFIDENCIALIDAD: Usted y/o el acompaante que le cuide han firmado documentos que se ingresarn en su historial mdico electrnico.  Estas firmas atestiguan el hecho   de que la informacin anterior  

## 2021-06-14 NOTE — Progress Notes (Signed)
Pt admitting with assistance of interpreter Tracie Tapia ?

## 2021-06-14 NOTE — Progress Notes (Signed)
? ?GASTROENTEROLOGY PROCEDURE H&P NOTE  ? ?Primary Care Physician: ?Mayer Masker, PA-C ? ? ? ?Reason for Procedure:  RUQ pain, nausea/vomiting, abdominal bloating, diarrhea ? ?Plan:    EGD, colonoscopy ? ?Patient is appropriate for endoscopic procedure(s) in the ambulatory ([facility]) setting. ? ?The nature of the procedure, as well as the risks, benefits, and alternatives were carefully and thoroughly reviewed with the patient. Ample time for discussion and questions allowed. The patient understood, was satisfied, and agreed to proceed.  ? ? ? ?HPI: ?Tracie Tapia is a 26 y.o. female who presents for EGD and colonoscopy for evaluation of RUQ pain, nausea/vomiting, abdominal bloating, diarrhea. ? ?Recent evaluation otherwise largely unrevealing to include normal CBC, CMP.  GI PCR panel with Sapovirus detected (likely incidental and not causative), otherwise negative.  Normal CT abdomen/pelvis.  RUQ [place] with 5 mm GB sludge ball vs polyp and hepatic steatosis, without GB wall thickening or duct dilation. ? ?Past Medical History:  ?Diagnosis Date  ? Asthma   ? Hyperlipidemia   ? Thyroid disease   ? ? ?Past Surgical History:  ?Procedure Laterality Date  ? CESAREAN SECTION  2017  ? ? ?Prior to Admission medications   ?Medication Sig Start Date End Date Taking? Authorizing Provider  ?dicyclomine (BENTYL) 10 MG capsule Take 1 capsule (10 mg total) by mouth 3 (three) times daily as needed for spasms. 05/02/21  Yes Mayer Masker, PA-C  ?diphenoxylate-atropine (LOMOTIL) 2.5-0.025 MG tablet One tablet twice daily PRN diarrhea stop taking 06/11/21 05/26/21  Yes Meredith Pel, NP  ?metoCLOPramide (REGLAN) 10 MG tablet Take 1 tablet 1 hour before each dose of colon prep 05/30/21  Yes Meredith Pel, NP  ?metroNIDAZOLE (FLAGYL) 500 MG tablet Take 1 tablet (500 mg total) by mouth 2 (two) times daily. 05/19/21  Yes Warden Fillers, MD  ?ondansetron (ZOFRAN) 4 MG tablet Take 1 tablet (4 mg total) by mouth every 8 (eight)  hours as needed for nausea or vomiting. 05/26/21  Yes Meredith Pel, NP  ?methocarbamol (ROBAXIN) 500 MG tablet Take 1 tablet (500 mg total) by mouth every 8 (eight) hours as needed for muscle spasms. 05/02/21   Mayer Masker, PA-C  ? ? ?Current Outpatient Medications  ?Medication Sig Dispense Refill  ? dicyclomine (BENTYL) 10 MG capsule Take 1 capsule (10 mg total) by mouth 3 (three) times daily as needed for spasms. 90 capsule 1  ? diphenoxylate-atropine (LOMOTIL) 2.5-0.025 MG tablet One tablet twice daily PRN diarrhea stop taking 06/11/21 30 tablet 0  ? metoCLOPramide (REGLAN) 10 MG tablet Take 1 tablet 1 hour before each dose of colon prep 3 tablet 0  ? metroNIDAZOLE (FLAGYL) 500 MG tablet Take 1 tablet (500 mg total) by mouth 2 (two) times daily. 14 tablet 0  ? ondansetron (ZOFRAN) 4 MG tablet Take 1 tablet (4 mg total) by mouth every 8 (eight) hours as needed for nausea or vomiting. 30 tablet 0  ? methocarbamol (ROBAXIN) 500 MG tablet Take 1 tablet (500 mg total) by mouth every 8 (eight) hours as needed for muscle spasms. 30 tablet 2  ? ?Current Facility-Administered Medications  ?Medication Dose Route Frequency Provider Last Rate Last Admin  ? 0.9 %  sodium chloride infusion  500 mL Intravenous Once Aribella Vavra V, DO      ? ? ?Allergies as of 06/14/2021  ? (No Known Allergies)  ? ? ?Family History  ?Problem Relation Age of Onset  ? Breast cancer Mother   ? Cancer Mother   ?  Ovarian, Breast  ? Hypertension Mother   ? Hyperlipidemia Mother   ? Diabetes Mother   ? Liver disease Father   ? Hypertension Father   ? Hyperlipidemia Father   ? Diabetes Father   ? Depression Maternal Grandmother   ? Hypertension Maternal Grandmother   ? Hyperlipidemia Maternal Grandmother   ? Diabetes Maternal Grandmother   ? Cancer Maternal Grandmother   ?     Ovarian, Breast  ? Esophageal cancer Maternal Grandfather   ? Colon cancer Maternal Grandfather   ? Hypertension Maternal Grandfather   ? Hyperlipidemia Maternal  Grandfather   ? Diabetes Maternal Grandfather   ? Stomach cancer Paternal Grandmother   ? Hypertension Paternal Grandmother   ? Hyperlipidemia Paternal Grandmother   ? Diabetes Paternal Grandmother   ? Heart disease Paternal Grandmother   ? Liver disease Paternal Grandfather   ? Hypertension Paternal Grandfather   ? Hyperlipidemia Paternal Grandfather   ? Diabetes Paternal Grandfather   ? ? ?Social History  ? ?Socioeconomic History  ? Marital status: Married  ?  Spouse name: Not on file  ? Number of children: 1  ? Years of education: Not on file  ? Highest education level: Not on file  ?Occupational History  ? Not on file  ?Tobacco Use  ? Smoking status: Former  ?  Packs/day: 2.00  ?  Years: 1.00  ?  Pack years: 2.00  ?  Types: Cigarettes  ?  Quit date: 2020  ?  Years since quitting: 3.3  ? Smokeless tobacco: Never  ?Vaping Use  ? Vaping Use: Never used  ?Substance and Sexual Activity  ? Alcohol use: Not Currently  ? Drug use: Never  ? Sexual activity: Yes  ?  Partners: Male  ?  Birth control/protection: None  ?Other Topics Concern  ? Not on file  ?Social History Narrative  ? Not on file  ? ?Social Determinants of Health  ? ?Financial Resource Strain: Not on file  ?Food Insecurity: Not on file  ?Transportation Needs: Not on file  ?Physical Activity: Not on file  ?Stress: Not on file  ?Social Connections: Not on file  ?Intimate Partner Violence: Not on file  ? ? ?Physical Exam: ?Vital signs in last 24 hours: ?@BP  109/78   Pulse 61   Temp 98 ?F (36.7 ?C)   Ht 5\' 4"  (1.626 m)   Wt 183 lb (83 kg)   LMP 05/18/2021 (Exact Date)   SpO2 100%   BMI 31.41 kg/m?  ?GEN: NAD ?EYE: Sclerae anicteric ?ENT: MMM ?CV: Non-tachycardic ?Pulm: CTA b/l ?GI: Soft, NT/ND ?NEURO:  Alert & Oriented x 3 ? ? ? , DO ?[facility] ? ? ?06/14/2021 2:43 PM ? ?

## 2021-06-14 NOTE — Progress Notes (Signed)
To Pacu, VSS. Report to [email or web address] 

## 2021-06-14 NOTE — Progress Notes (Signed)
Called to room to assist during endoscopic procedure.  Patient ID and intended procedure confirmed with present staff. Received instructions for my participation in the procedure from the performing physician.  

## 2021-06-14 NOTE — Op Note (Signed)
[facility] ?Patient Name: Tracie Tapia ?Procedure Date: 06/14/2021 2:52 PM ?MRN: [id number] ?Endoscopist: Gerrit Heck , MD ?Age: 26 ?Referring MD:  ?Date of Birth: 11-12-1995 ?Gender: Female ?Account #: [id number] ?Procedure:                Upper GI endoscopy ?Indications:              Abdominal pain in the right upper quadrant,  ?                          Abdominal bloating, Diarrhea, Nausea with vomiting ?Medicines:                Monitored Anesthesia Care ?Procedure:                Pre-Anesthesia Assessment: ?                          - Prior to the procedure, a History and Physical  ?                          was performed, and patient medications and  ?                          allergies were reviewed. The patient's tolerance of  ?                          previous anesthesia was also reviewed. The risks  ?                          and benefits of the procedure and the sedation  ?                          options and risks were discussed with the patient.  ?                          All questions were answered, and informed consent  ?                          was obtained. Prior Anticoagulants: The patient has  ?                          taken no previous anticoagulant or antiplatelet  ?                          agents. ASA Grade Assessment: II - A patient with  ?                          mild systemic disease. After reviewing the risks  ?                          and benefits, the patient was deemed in  ?                          satisfactory condition to undergo the procedure. ?  After obtaining informed consent, the endoscope was  ?                          passed under direct vision. Throughout the  ?                          procedure, the patient's blood pressure, pulse, and  ?                          oxygen saturations were monitored continuously. The  ?                          Endoscope was introduced through the mouth, and  ?                           advanced to the second part of duodenum. The upper  ?                          GI endoscopy was accomplished without difficulty.  ?                          The patient tolerated the procedure well. ?Scope In: ?Scope Out: ?Findings:                 LA Grade A (one or more mucosal breaks less than 5  ?                          mm, not extending between tops of 2 mucosal folds)  ?                          esophagitis with no bleeding was found at the  ?                          gastroesophageal junction. ?                          The upper third of the esophagus and middle third  ?                          of the esophagus were normal. ?                          Localized mild inflammation characterized by  ?                          erythema was found at the pre-pyloric antrum. There  ?                          was a single small, non-bleeding erosion in the  ?                          distal gastric body. Biopsies were taken with a  ?  cold forceps for Helicobacter pylori testing.  ?                          Estimated blood loss was minimal. ?                          A few small sessile polyps with no bleeding and no  ?                          stigmata of recent bleeding were found in the  ?                          gastric fundus and in the gastric body. These  ?                          polyps were removed with a cold biopsy forceps.  ?                          Resection and retrieval were complete. Estimated  ?                          blood loss was minimal. ?                          The examined duodenum was normal. Biopsies were  ?                          taken with a cold forceps for histology. Estimated  ?                          blood loss was minimal. ?Complications:            No immediate complications. ?Estimated Blood Loss:     Estimated blood loss was minimal. ?Impression:               - LA Grade A esophagitis with no bleeding. ?                          - Normal upper  third of esophagus and middle third  ?                          of esophagus. ?                          - Mild, non-ulcer gastritis with a single  ?                          non-bleeding, small erosion. Biopsied. ?                          - A few benign appearing gastric polyps. Resected  ?                          and retrieved. ?                          -  Normal examined duodenum. Biopsied. ?Recommendation:           - Patient has a contact number available for  ?                          emergencies. The signs and symptoms of potential  ?                          delayed complications were discussed with the  ?                          patient. Return to normal activities tomorrow.  ?                          Written discharge instructions were provided to the  ?                          patient. ?                          - Resume previous diet. ?                          - Continue present medications. ?                          - Await pathology results. ?                          - Colonoscopy today. ?                          - Follow-up with Tye Savoy in the GI clinic  ?                          PRN. ?Gerrit Heck, MD ?06/14/2021 3:21:11 PM ?

## 2021-06-16 ENCOUNTER — Telehealth: Payer: Self-pay

## 2021-06-16 NOTE — Telephone Encounter (Signed)
Attempted to reach patient for post-procedure f/u call. No answer. Unable to leave message as voicemail box is not set up. ?

## 2021-06-16 NOTE — Telephone Encounter (Signed)
Attempted to reach patient for post-procedure f/u call (2nd attempt). No answer and unable to leave message as voicemail not set up. ?

## 2021-06-17 ENCOUNTER — Ambulatory Visit: Payer: Medicaid Other | Admitting: Obstetrics and Gynecology

## 2021-06-17 VITALS — BP 111/72 | HR 78 | Wt 179.0 lb

## 2021-06-17 DIAGNOSIS — N926 Irregular menstruation, unspecified: Secondary | ICD-10-CM

## 2021-06-17 MED ORDER — METFORMIN HCL ER 500 MG PO TB24: ORAL_TABLET | ORAL | 4 refills | Status: DC

## 2021-06-17 NOTE — Progress Notes (Signed)
Interpreter used for visit -  [id number] ? ?Pt has not had cycle this month, hx of irregular. ? ? ? ?

## 2021-06-17 NOTE — Progress Notes (Signed)
?  CC: irregular menses ?Subjective:  ? ? Patient ID: Diona Browner, female    DOB: 1996-01-05, 26 y.o.   MRN: [id number] ? ?HPI ?26 yo G2P1 seen for follow up for irregular menses.  Fasting insulin was not elevated.  She has not had a menses this month, on site UPT was negative.  She does desire regular menses and pregnancy.  Discussed metformin therapy and pt notes she desires to try the therapy. ? ? ?Review of Systems ? ?   ?Objective:  ? Physical Exam ?Vitals:  ? 06/17/21 0851  ?BP: 111/72  ?Pulse: 78  ? ? ? ? ?   ?Assessment & Plan:  ? ?1. Irregular menses ?Trial of metformin, will use metformin XR 500 mg with ascending doses to 1500 mg. ?Follow up in 3 months to see if there is resumption of normal cycles.  Still advise losing 10 % body weight. ? ?- metFORMIN (GLUCOPHAGE-XR) 500 MG 24 hr tablet; Take one tablet at dinner for a week, then increase to two tablets at dinner for a week then 3 tablets at dinner  Dispense: 90 tablet; Refill: 4 ? ? ? ?Warden Fillers, MD ?Faculty Attending, [facility]  ?

## 2021-06-22 ENCOUNTER — Encounter: Payer: Self-pay | Admitting: Gastroenterology

## 2021-07-12 ENCOUNTER — Other Ambulatory Visit: Payer: Self-pay | Admitting: Physician Assistant

## 2021-07-12 DIAGNOSIS — G8929 Other chronic pain: Secondary | ICD-10-CM

## 2021-07-12 DIAGNOSIS — J4520 Mild intermittent asthma, uncomplicated: Secondary | ICD-10-CM

## 2021-07-12 MED ORDER — SODIUM CHLORIDE 0.9 % IN NEBU: 3.0000 mL | INHALATION_SOLUTION | RESPIRATORY_TRACT | 3 refills | Status: AC | PRN

## 2021-07-12 MED ORDER — METHOCARBAMOL 500 MG PO TABS: 500.0000 mg | ORAL_TABLET | Freq: Three times a day (TID) | ORAL | 2 refills | Status: DC | PRN

## 2021-08-10 ENCOUNTER — Telehealth: Payer: Self-pay | Admitting: Physician Assistant

## 2021-08-10 NOTE — Telephone Encounter (Signed)
Patient requests refill for Zofran, lomotil and robaxin. Please advise.

## 2021-08-10 NOTE — Telephone Encounter (Signed)
Patient aware.

## 2021-09-07 ENCOUNTER — Ambulatory Visit (INDEPENDENT_AMBULATORY_CARE_PROVIDER_SITE_OTHER): Payer: Medicaid Other | Admitting: Physician Assistant

## 2021-09-07 ENCOUNTER — Encounter: Payer: Self-pay | Admitting: Physician Assistant

## 2021-09-07 VITALS — BP 101/64 | HR 71 | Temp 97.7°F | Ht 62.0 in | Wt 182.0 lb

## 2021-09-07 DIAGNOSIS — R21 Rash and other nonspecific skin eruption: Secondary | ICD-10-CM | POA: Diagnosis not present

## 2021-09-07 MED ORDER — METHYLPREDNISOLONE 4 MG PO TBPK: ORAL_TABLET | ORAL | 0 refills | Status: AC

## 2021-09-07 MED ORDER — METHYLPREDNISOLONE SODIUM SUCC 40 MG IJ SOLR
40.0000 mg | Freq: Once | INTRAMUSCULAR | Status: AC
  Administered 2021-09-07: 40 mg via INTRAMUSCULAR

## 2021-09-07 MED ORDER — CALAMINE EX LOTN: 1.0000 | TOPICAL_LOTION | CUTANEOUS | 0 refills | Status: AC | PRN

## 2021-09-07 MED ORDER — BETAMETHASONE VALERATE 0.1 % EX OINT: 1.0000 | TOPICAL_OINTMENT | Freq: Two times a day (BID) | CUTANEOUS | 0 refills | Status: AC

## 2021-09-07 NOTE — Progress Notes (Signed)
  Established patient acute visit   Patient: Tracie Tapia   DOB: 30-Nov-1995   25 y.o. Female  MRN: [id number] Visit Date: 09/07/2021  Chief Complaint  Patient presents with   Rash   Subjective    HPI  Patient presenting with itchy rash. Rash started at both arms and is spreading to lower legs and trunk. No changes with detergent or soap. Cold compresses help with itching. Has been applying topical spray for anti-itch. Does report her sister got a new dog. No shortness of breath, facial swelling or tingling tongue.     Medications: Outpatient Medications Prior to Visit  Medication Sig   dicyclomine (BENTYL) 10 MG capsule Take 1 capsule (10 mg total) by mouth 3 (three) times daily as needed for spasms.   diphenoxylate-atropine (LOMOTIL) 2.5-0.025 MG tablet One tablet twice daily PRN diarrhea stop taking 06/11/21   metFORMIN (GLUCOPHAGE-XR) 500 MG 24 hr tablet Take one tablet at dinner for a week, then increase to two tablets at dinner for a week then 3 tablets at dinner   methocarbamol (ROBAXIN) 500 MG tablet Take 1 tablet (500 mg total) by mouth every 8 (eight) hours as needed for muscle spasms.   metoCLOPramide (REGLAN) 10 MG tablet Take 1 tablet 1 hour before each dose of colon prep   ondansetron (ZOFRAN) 4 MG tablet Take 1 tablet (4 mg total) by mouth every 8 (eight) hours as needed for nausea or vomiting.   sodium chloride 0.9 % nebulizer solution Take 3 mLs by nebulization as needed for wheezing.   No facility-administered medications prior to visit.    Review of Systems Review of Systems:  A fourteen system review of systems was performed and found to be positive as per HPI.     Objective    BP 101/64   Pulse 71   Temp 97.7 F (36.5 C)   Ht 5\' 2"  (1.575 m)   Wt 182 lb (82.6 kg)   SpO2 100%   BMI 33.29 kg/m    Physical Exam  General:  Pleasant and cooperative, in no acute distress, appropriate for stated age.  Neuro:  Alert and oriented,  extra-ocular  muscles intact  HEENT:  Normocephalic, atraumatic, neck supple  Skin:  red raised bumps scattered at both legs, arms, abdomen and 2 on left side of face, there is evidence of excoriations, no vesicular lesions noted, few papules on arms  Cardiac:  RRR, S1 S2 Respiratory: CTA B/L  Vascular:  Ext warm, no cyanosis apprec.; cap RF less 2 sec. Psych:  No HI/SI, judgement and insight good, Euthymic mood. Full Affect.   No results found for any visits on 09/07/21.  Assessment & Plan     Discussed with patient rash is suggestive of hives which could be secondary to an allergic reaction with something she came into contact with. Will administer Solu-Medrol 40 mg in office and advised to start oral steroid tomorrow. Advised to apply topical corticosteroid to scattered lesions except face. Recommend to use calamine lotion to help with itching and can continue with benadryl as needed every 4-6 hours.    Return if symptoms worsen or fail to improve.        09/09/21, PA-C  [facility] [phone] (phone) [phone] (fax)  [facility]

## 2021-09-07 NOTE — Patient Instructions (Signed)
Erupcin cutnea en los adultos Rash, Adult  Una erupcin es un cambio en el color de la piel. Una erupcin tambin puede cambiar la forma en que se siente la piel. Hay muchas afecciones y Continental Airlines que pueden causar una erupcin. Siga estas indicaciones en su casa: El objetivo del tratamiento es calmar la picazn y evitar que la erupcin se propague. Controle si hay algn cambio en sus sntomas. Informe a su mdico acerca de los cambios. Estas indicaciones pueden ayudarlo con la afeccin: Medicamentos Tome o aplique los medicamentos de venta libre y los recetados solamente como se lo haya indicado el mdico. Esto puede incluir medicamentos: Para tratar la piel enrojecida o hinchada (cremas con corticoesteroides). Para tratar la picazn. Para tratar Vella Raring (antihistamnicos por va oral). Para tratar sntomas muy graves (corticoesteroides por va oral).  Cuidado de la piel Coloque paos fros (compresas) en las zonas afectadas. No se rasque ni se refriegue la piel. Evite cubrir la erupcin. Asegrese de que la erupcin est expuesta al aire todo lo posible. Control de la picazn y las Omnicare baos o las duchas calientes. Estos pueden empeorar la picazn. Neomia Dear ducha fra puede Acupuncturist. Trate de tomar un bao con lo siguiente: Sales de [place]. Puede conseguirlas en la tienda de comestibles o la farmacia local. Siga las indicaciones del envase. Bicarbonato de sodio. Vierta un poco en la baera como se lo haya indicado el mdico. Avena coloidal. Puede conseguirla en la tienda de comestibles o la farmacia local. Siga las indicaciones del envase. Intente colocarse una pasta de bicarbonato de [employer]. Agregue agua al bicarbonato de sodio hasta que se forme una pasta. Intente aplicarse una locin que Union Pacific Corporation picazn (locin de calamina). Mantngase fresco y al resguardo del sol. La transpiracin y el calor pueden empeorar la picazn. Indicaciones  generales  Descanse todo lo que sea necesario. Beba suficiente lquido para Radio producer pis (la orina) de color amarillo plido. Use ropa holgada. Evite los detergentes y los jabones perfumados, y los perfumes. Utilice jabones, detergentes, perfumes y cosmticos suaves. Evite todo lo que le cause erupcin cutnea. Lleve un diario como ayuda para registrar lo que le causa erupcin. Escriba los siguientes datos: Lo que come. Los cosmticos que [place]. Lo que bebe. La ropa que [place]. Esto incluye las alhajas. Concurra a todas las visitas de [address] se lo haya indicado el mdico. Esto es importante. Comunquese con un mdico si: Transpira de noche. Pierde peso. Hace pis (orina) ms de lo normal. Orina menos de lo normal u observa que la orina es de color ms oscuro que lo normal. Se siente dbil. Devuelve (vomita). Tiene un color amarillo en la piel o en las partes blancas del ojo (ictericia). La piel: Hormiguea. Se adormece. La erupcin cutnea: No desaparece despus de The Mutual of Omaha. Empeora. Usted: Est ms sediento que lo habitual. Est ms cansado que lo habitual. Tiene los siguientes sntomas: Sntomas nuevos. Dolor en el vientre (abdomen). Grant Ruts. Materia fecal lquida (diarrea). Solicite ayuda de inmediato si: Lance Muss, y los sntomas empeoran repentinamente. Empieza a sentirse desorientado (confundido). Siente un dolor de cabeza intenso o tiene rigidez en el cuello. Siente mucho dolor o rigidez en las articulaciones. Tiene una crisis de movimientos que no puede controlar (convulsiones). La erupcin cubre todo el cuerpo o la mayor parte de [place]. La erupcin puede o no ser dolorosa. Tiene ampollas con las siguientes caractersticas: Se encuentran arriba de la erupcin. Se agrandan. Crecen juntas. Son  dolorosas. Estn dentro de la nariz o la boca. Tiene una erupcin cutnea con estas caractersticas: Tiene pequeas manchas moradas, como si fueran pinchazos,  en todo el cuerpo. Tiene un "ojo de buey" o se parece a un blanco de tiro. Est enrojecida y Market researcher, le produce descamacin de la piel y no se relaciona con haber estado mucho tiempo bajo el sol. Resumen Una erupcin es un cambio en el color de la piel. Una erupcin tambin puede cambiar la forma en que se siente la piel. El objetivo del tratamiento es calmar la picazn y evitar que la erupcin se propague. Tome o Energy East Corporation medicamentos de venta libre y los recetados solamente como se lo haya indicado el mdico. Comunquese con un mdico de inmediato si tiene sntomas nuevos o si sus sntomas empeoran. Concurra a todas las visitas de [address] se lo haya indicado el mdico. Esto es importante. Esta informacin no tiene Theme park manager el consejo del mdico. Asegrese de hacerle al mdico cualquier pregunta que tenga. Document Revised: 12/23/2020 Document Reviewed: 12/23/2020 Elsevier Patient Education  2023 Arvin[employer]itor.

## 2021-09-14 ENCOUNTER — Encounter: Payer: Self-pay | Admitting: Obstetrics and Gynecology

## 2021-09-14 ENCOUNTER — Ambulatory Visit: Payer: Medicaid Other | Admitting: Obstetrics and Gynecology

## 2021-09-14 VITALS — BP 116/72 | HR 61 | Ht 62.0 in | Wt 181.9 lb

## 2021-09-14 DIAGNOSIS — N926 Irregular menstruation, unspecified: Secondary | ICD-10-CM

## 2021-09-14 MED ORDER — METFORMIN HCL ER 500 MG PO TB24: ORAL_TABLET | ORAL | 4 refills | Status: DC

## 2021-09-14 NOTE — Progress Notes (Signed)
  CC: PCOS follow up Subjective:    Patient ID: Tracie Tapia, female    DOB: [birth year], 26 y.o.   MRN: [id number]  HPI Pt seen with spouse and spanish interpreter.  Pt notes LMP 07/14/21.  Pt did not have menses in June, but believes she is starting her menses today.  She briefly stopped her metformin about 2 weeks ago because she thought it was associated with a rash.  Pt is primarily interested in getting pregnant right now. She notes it took her a long time to get pregnant the first time too.  Discussed she is young so advised continued metformin and weight loss with timed intercourse the next 5-6 months.  If unsuccessful, consider trial of clomid.  Spouse advised to obtain semen analysis if possible.   Review of Systems     Objective:   Physical Exam Vitals:   09/14/21 0928  BP: 116/72  Pulse: 61         Assessment & Plan:   1. Irregular menses Continue metformin Menstrual calender Timed intercourse week of day 14, every other day If no pregnancy in 6 months, consider clomid followed by referral to fertility specialist  - metFORMIN (GLUCOPHAGE-XR) 500 MG 24 hr tablet; Take one tablet at dinner for a week, then increase to two tablets at dinner for a week then 3 tablets at dinner  Dispense: 90 tablet; Refill: 4  F/u in 5 months  I spent 30 minutes dedicated to the care of this patient including previsit review of records, face to face time with the patient discussing current plan and post visit testing.   Warden Fillers, MD Faculty Attending, [facility]

## 2021-09-14 NOTE — Progress Notes (Signed)
Pt presents for f/u. Pt states periods have still been irregular. LMP 07/14/21. Pt had 2 day of bleeding in June and is having some spotting today. Pt stopped Metformin 2 weeks ago.

## 2021-09-22 ENCOUNTER — Other Ambulatory Visit: Payer: Medicaid Other

## 2021-09-22 DIAGNOSIS — E7889 Other lipoprotein metabolism disorders: Secondary | ICD-10-CM | POA: Diagnosis not present

## 2021-09-23 ENCOUNTER — Encounter: Payer: Self-pay | Admitting: Physician Assistant

## 2021-09-23 DIAGNOSIS — R21 Rash and other nonspecific skin eruption: Secondary | ICD-10-CM

## 2021-09-23 DIAGNOSIS — L299 Pruritus, unspecified: Secondary | ICD-10-CM

## 2021-09-23 LAB — LIPID PANEL
Chol/HDL Ratio: 6.2 ratio — ABNORMAL HIGH (ref 0.0–4.4)
Cholesterol, Total: 234 mg/dL — ABNORMAL HIGH (ref 100–199)
HDL: 38 mg/dL — ABNORMAL LOW (ref >39)
LDL Chol Calc (NIH): 147 mg/dL — ABNORMAL HIGH (ref 0–99)
Triglycerides: 267 mg/dL — ABNORMAL HIGH (ref 0–149)
VLDL Cholesterol Cal: 49 mg/dL — ABNORMAL HIGH (ref 5–40)

## 2021-09-27 MED ORDER — FAMOTIDINE 20 MG PO TABS: 20.0000 mg | ORAL_TABLET | Freq: Two times a day (BID) | ORAL | 2 refills | Status: AC

## 2021-09-27 MED ORDER — CETIRIZINE HCL 10 MG PO TABS: 10.0000 mg | ORAL_TABLET | Freq: Every day | ORAL | 2 refills | Status: AC

## 2022-01-03 ENCOUNTER — Encounter: Payer: Self-pay | Admitting: Physician Assistant

## 2022-01-03 ENCOUNTER — Ambulatory Visit: Payer: Medicaid Other | Admitting: Physician Assistant

## 2022-01-03 VITALS — BP 109/70 | HR 61 | Resp 18 | Ht 62.0 in | Wt 189.0 lb

## 2022-01-03 DIAGNOSIS — M546 Pain in thoracic spine: Secondary | ICD-10-CM

## 2022-01-03 DIAGNOSIS — G8929 Other chronic pain: Secondary | ICD-10-CM

## 2022-01-03 DIAGNOSIS — M5410 Radiculopathy, site unspecified: Secondary | ICD-10-CM | POA: Diagnosis not present

## 2022-01-03 MED ORDER — METHOCARBAMOL 500 MG PO TABS: 500.0000 mg | ORAL_TABLET | Freq: Three times a day (TID) | ORAL | 1 refills | Status: AC | PRN

## 2022-01-03 NOTE — Progress Notes (Signed)
Established patient acute visit   Patient: Tracie Tapia   DOB: 1995/10/24   26 y.o. Female  MRN: [id number] Visit Date: 01/03/2022  Chief Complaint  Patient presents with   Back Pain    Lower back    Subjective    HPI HPI     Back Pain    Additional comments: Lower back       Last edited by Tonny Bollman, CMA on 01/03/2022  3:12 PM.      Patient presents with c/o low back pain that comes and goes which has been ongoing for several years. Reports her husband felt a small bump about 2 days ago. Also reports numbness, tingling and burning sensation of both lower legs which can be on the front and back. Laying down helps relief some of the symptoms. Does report 3 falls in the past where she had landed on her bottom with one being falling from a horse. Does report sometimes has trouble with lifting or bending because of the pain. States muscle relaxer provides some relief.    Medications: Outpatient Medications Prior to Visit  Medication Sig   betamethasone valerate ointment (VALISONE) 0.1 % Apply 1 Application topically 2 (two) times daily.   calamine lotion Apply 1 Application topically as needed for itching.   cetirizine (ZYRTEC) 10 MG tablet Take 1 tablet (10 mg total) by mouth daily.   famotidine (PEPCID) 20 MG tablet Take 1 tablet (20 mg total) by mouth 2 (two) times daily.   methylPREDNISolone (MEDROL DOSEPAK) 4 MG TBPK tablet Take as directed on package.   metoCLOPramide (REGLAN) 10 MG tablet Take 1 tablet 1 hour before each dose of colon prep   ondansetron (ZOFRAN) 4 MG tablet Take 1 tablet (4 mg total) by mouth every 8 (eight) hours as needed for nausea or vomiting.   sodium chloride 0.9 % nebulizer solution Take 3 mLs by nebulization as needed for wheezing.   dicyclomine (BENTYL) 10 MG capsule Take 1 capsule (10 mg total) by mouth 3 (three) times daily as needed for spasms. (Patient not taking: Reported on 09/14/2021)   diphenoxylate-atropine (LOMOTIL)  2.5-0.025 MG tablet One tablet twice daily PRN diarrhea stop taking 06/11/21 (Patient not taking: Reported on 09/14/2021)   metFORMIN (GLUCOPHAGE-XR) 500 MG 24 hr tablet Take one tablet at dinner for a week, then increase to two tablets at dinner for a week then 3 tablets at dinner (Patient not taking: Reported on 01/03/2022)   [DISCONTINUED] methocarbamol (ROBAXIN) 500 MG tablet Take 1 tablet (500 mg total) by mouth every 8 (eight) hours as needed for muscle spasms. (Patient not taking: Reported on 09/14/2021)   No facility-administered medications prior to visit.    Review of Systems Review of Systems:  A fourteen system review of systems was performed and found to be positive as per HPI.     Objective    BP 109/70 (BP Location: Right Arm, Patient Position: Sitting, Cuff Size: Large)   Pulse 61   Resp 18   Ht 5\' 2"  (1.575 m)   Wt 189 lb (85.7 kg)   SpO2 99%   BMI 34.57 kg/m    Physical Exam  General:  Pleasant and cooperative, appropriate for stated age.  Neuro:  Alert and oriented,  extra-ocular muscles intact  HEENT:  Normocephalic, atraumatic, neck supple  Skin:  no gross rash, warm, pink. Cardiac:  RRR, S1 S2 Respiratory: CTA B/L  MSK: mid thoracic and low back tenderness, no masses or deformity noted, overall good  ROM, positive straight leg raise b/l Vascular:  Ext warm, no cyanosis apprec.; cap RF less 2 sec. Psych:  No HI/SI, judgement and insight good, Euthymic mood. Full Affect.   No results found for any visits on 01/03/22.  Assessment & Plan     Patient presenting with chronic thoracic and back pain with signs of lumbar radiculopathy. Will place order for imaging studies- thoracic and lumbar xrays. Discussed pending imaging results recommend referral to orthopedics or neurosurgery. Will provide refill of muscle relaxer to take as needed.    Return if symptoms worsen or fail to improve.        Mayer Masker, PA-C  [facility] at [facility] [phone] (phone) [phone] (fax)  [facility]

## 2022-01-03 NOTE — Patient Instructions (Addendum)
[facility] [address] [place]  Radiculopata lumbosacra Lumbosacral Radiculopathy La radiculopata lumbosacra es una afeccin que compromete los nervios raqudeos, y las races nerviosas de la parte baja de la espalda y Chief Operating Officerel sacro. La afeccin se manifiesta cuando estos nervios y las races nerviosas se desplazan o se inflaman y causan sntomas. Cules son las causas? Esta afeccin puede ser causada por lo siguiente: Presin en un disco que sobresale de su lugar (hernia de disco). Un disco es una placa de cartlago blando que separa los huesos de la columna. Cambios en los discos que se producen con la edad (degeneracin de los discos). Un estrechamiento de los huesos de la parte baja de la espalda (estenosis del conducto raqudeo). Un tumor. Una infeccin. Una lesin que ejerce una presin repentina en los discos que amortiguan los huesos de la parte ms baja de la columna. Qu incrementa el riesgo? Es ms probable que sufra esta afeccin si: Es hombre y tiene entre 30 y 50 aos de edad. Es mujer y Damascustiene entre 50 y 60 aos de edad. [place] una tcnica incorrecta al levantar objetos. Tiene sobrepeso o lleva un estilo de vida sedentario. Fuma. Su trabajo requiere levantar objetos pesados con frecuencia. Hace actividades repetitivas que tensionan la columna vertebral. Cules son los signos o sntomas? Los sntomas de esta afeccin incluyen: Dolor que baja por la espalda hasta las piernas (citica), generalmente en un lado del cuerpo. Este es el sntoma ms frecuente. El dolor puede empeorar al sentarse, toser o estornudar. Hormigueo y entumecimiento en las piernas. Debilidad muscular en las piernas. Prdida del control del intestino o de la vejiga. Cmo se diagnostica? Esta afeccin se puede diagnosticar en funcin de lo siguiente: Los sntomas y los antecedentes mdicos. Un examen fsico. Si el dolor dura, pueden hacerle estudios, por ejemplo: Resonancia  magntica (RM). Radiografa. Una exploracin por tomografa computarizada (TC). Un tipo de exploracin por tomografa computarizada (TC) que se [place] para examinar el canal raqudeo despus de inyectar un tinte en la columna vertebral (mielografa). Un estudio para medir cmo los impulsos elctricos se mueven a travs del nervio (estudio de conduccin nerviosa). Un estudio para medir la Advertising account executiveactividad elctrica de los msculos (electromiografa). Cmo se trata? En muchos casos, no se requiere tratamiento para esta afeccin. Con reposo, esta suele mejorar con Allied Waste Industriesel tiempo. Si se necesita tratamiento, este puede incluir lo siguiente: PrintmakerTrabajar con un fisioterapeuta para mejorar la fuerza y la flexibilidad. Tomar analgsicos. Aplicar calor o hielo, o ambos, en la zona afectada. Someterse a Chartered certified accountantmanipulacin quiroprctica de la columna vertebral. Recibir terapia de estimulacin nerviosa elctrica transcutnea (ENET). Recibir una inyeccin de corticoesteroides en la columna vertebral. Someterse a Cipriano Mileuna ciruga. Esto puede ser necesario si otros tratamientos no son eficaces. Segn la causa de esta afeccin, podrn implementarse diferentes tipos de Azerbaijanciruga. Siga estas indicaciones en su casa: Actividad Evite agacharse y Education officer, environmentalrealizar cualquier otra actividad que agrave el problema. Mantenga una postura correcta mientras est de pie o sentado. Cuando est de pie, mantenga la parte alta de la espalda y el cuello rectos, con los hombros [place] atrs. Evite encorvarse. Cuando est sentado, mantenga la espalda recta y relaje los hombros. No curve los hombros ni los eche [place] atrs. No permanezca sentado o de pie en el mismo lugar durante mucho tiempo. Durante el da, descanse durante lapsos breves. Esto le Engineer, materialsaliviar el dolor. Por lo general, es mejor descansar acostado o de pie, no sentado. Incorpore alguna actividad suave o ejercicios de Bank of Americaelongacin entre los  perodos de descanso. Esto ayudar a Transport planner rigidez y Water quality scientist. Haga ejercicio con regularidad. Pregntele al mdico qu actividades son seguras para usted. Si le explicaron cmo realizar ejercicios o ejercicios de elongacin, hgalos como se lo haya indicado el mdico. Es posible que Personnel officer objetos. Pregntele al mdico cunto peso puede levantar sin correr Dover Corporation. Siempre use las tcnicas correctas para levantar objetos, entre ellas: Flexionar las rodillas. Mantener la carga cerca del cuerpo. No torcerse. Control del dolor Si se lo indican, aplique hielo sobre la zona afectada. Para hacer esto: Ponga el hielo en una bolsa plstica. Coloque una toalla entre la piel y Copy. Aplique el hielo durante 20 minutos, 2 o 3 veces por da. Retire el hielo si la piel se pone de color rojo brillante. Esto es Intel. Si no puede sentir dolor, calor o fro, tiene un mayor riesgo de que se dae la zona. Si se lo indican, aplique calor en la zona afectada con la frecuencia que le haya indicado el mdico. Use la fuente de calor que el mdico le recomiende, como una compresa de calor hmedo o una almohadilla trmica. Coloque una toalla entre la piel y la fuente de Airline pilot. Aplique calor durante 20 a 30 minutos. Retire la fuente de calor si la piel se pone de color rojo brillante. Esto es especialmente importante si no puede sentir dolor, calor o fro. Corre un mayor riesgo de sufrir quemaduras. Use los medicamentos de venta libre y los recetados solamente como se lo haya indicado el mdico. Indicaciones generales Duerma sobre un colchn firme en una posicin cmoda. Intente acostarse de costado, con las rodillas ligeramente flexionadas. Si se recuesta Fisher Scientific, coloque una almohada debajo de las rodillas. Pregntele al mdico si el medicamento recetado le impide conducir o usar [place]. Si el mdico le recomend Chesapeake Energy o un programa de ejercicios, cmplalos como se lo haya indicado. Concurra a todas las visitas de seguimiento.  Esto es importante. Comunquese con un mdico si: El dolor no mejora con el tiempo, incluso al tomar [employer] [place] [employer]. Solicite ayuda de inmediato si: Siente dolor intenso. El dolor empeora repentinamente. Aumenta la debilidad en las piernas. Pierde la capacidad de controlar la vejiga o el intestino. Tiene dificultad para caminar o mantener el equilibrio. Tiene fiebre. Resumen La radiculopata lumbosacra es una afeccin que se produce cuando los nervios raqudeos y las races nerviosas de la parte baja de la columna vertebral se desplazan o se inflaman y causan sntomas. Los sntomas Environmental education officer, adormecimiento y hormigueo que baja por la espalda hasta las piernas (citica), debilidad muscular y prdida del control del intestino o de la vejiga. Si se lo indican, aplquese hielo o calor, o ambos, en el rea afectada como se lo haya indicado el mdico. Siga las instrucciones acerca de realizar actividad fsica, hacer reposo y las tcnicas correctas para levantar objetos. Esta informacin no tiene Theme park manager el consejo del mdico. Asegrese de hacerle al mdico cualquier pregunta que tenga. Document Revised: 09/14/2020 Document Reviewed: 09/14/2020 Elsevier Patient Education  2023 Elsevier Inc.  Dolor de espalda crnico Chronic Back Pain Cuando el dolor en la espalda dura ms de 3 meses, se denomina dolor de espalda crnico. El dolor puede empeorar en ciertos momentos (exacerbaciones). Hay cosas que puede hacer en su casa para Runner, broadcasting/film/video. Siga estas instrucciones en su casa: Est atento a cualquier cambio en los sntomas. Tome estas medidas para Acupuncturist dolor: Control del Engineer, mining y de  la rigidez     Si se lo indican, aplique hielo sobre la zona dolorida. El mdico puede indicarle que use hielo durante las 24 a 48 horas luego del comienzo de Investment banker, corporate. Para hacer esto: Ponga el hielo en una bolsa plstica. Coloque una toalla entre la piel y Copy. Coloque el hielo  durante 20 minutos, 2 a 3 veces por da. Si se lo indican, aplique calor sobre la zona dolorida. Hgalo con la frecuencia que le haya indicado el mdico. Use la fuente de calor que el mdico le recomiende, como una compresa de calor hmedo o una almohadilla trmica. Coloque una toalla entre la piel y la fuente de Airline pilot. Aplique calor durante 20 a 30 minutos. Retire la fuente de calor si la piel se le pone de color rojo brillante. Esto es especialmente importante si no puede sentir dolor, calor o fro. Puede correr un riesgo mayor de sufrir quemaduras. Sumrjase en un bao clido. Esto puede ayudar a Engineer, materials. Actividad  Evite agacharse y Education officer, environmental otras actividades que Forensic scientist. Cuando est de pie: Mantenga la parte alta de la espalda y el cuello rectos. Mantenga los hombros Du Pont. Evite encorvarse. Cuando est sentado: Mantenga la espalda recta. Relaje los hombros. No curve los hombros ni los Darden Restaurants. No permanezca sentado o de pie en el mismo lugar durante mucho tiempo. Durante el da, haga pausas breves para descansar. Por lo general, recostarse o Personal assistant de pie es mejor que permanecer sentado. Descansar puede ayudar a Engineer, materials. Cuando est sentado o de pie por [employer], haga un poco de [place] o ejercicios de estiramiento. Esto ayudar a Transport planner rigidez y Chief Technology Officer. Haga ejercicio con regularidad. Pregntele al mdico qu actividades son seguras para usted. No levante ningn objeto que pese ms de 10 libras (4.5 kg) o el lmite de peso que le indiquen hasta que el mdico le diga que puede [place]. Para evitar lesionarse cuando levanta objetos: Flexione las rodillas. Mantenga el peso cerca del cuerpo. No se tuerza. Duerma sobre un NVR Inc. Intente acostarse de costado, con las rodillas ligeramente flexionadas. Si se recuesta Fisher Scientific, coloque una almohada debajo de las rodillas. Medicamentos El tratamiento puede  incluir medicamentos para Chief Technology Officer y la hinchazn que se toman por boca o que se ponen sobre la piel, analgsicos recetados o relajantes musculares. Use los medicamentos de venta libre y los recetados solamente como se lo haya indicado el mdico. Consulte a su mdico si el medicamento que le recetaron: Hace necesario que evite conducir o usar [place]. Puede causarle dificultad para defecar (estreimiento). Es posible que deba tomar medidas para prevenir o tratar los problemas para defecar: Product manager suficiente lquido para Radio producer pis (la orina) de color amarillo plido. Usar medicamentos recetados o de Sales promotion account executive. [place]er alimentos ricos en fibra. Entre ellos, frijoles, cereales integrales y frutas y verduras frescas. Limitar los alimentos con alto contenido de grasa y International aid/development worker. Estos incluyen alimentos fritos o dulces. Instrucciones generales No consuma ningn producto que contenga nicotina o tabaco, como cigarrillos, cigarrillos electrnicos y tabaco de Theatre manager. Si necesita ayuda para dejar de consumir estos productos, consulte al mdico. Concurra a todas las visitas de seguimiento como se lo haya indicado el mdico. Esto es importante. Comunquese con un mdico si: El dolor no se alivia con reposo ni con medicamentos. Su dolor empeora o tiene un dolor nuevo. Tiene fiebre alta. Pierde peso muy rpidamente. Tiene dificultad para Ameren Corporation  actividades cotidianas. Solicite ayuda de inmediato si: Siente debilidad en una o ambas piernas o pies. Pierde la sensibilidad (adormecimiento) en una o ambas piernas o pies. Tiene dificultad para Scientist, physiological materia fecal (las deposiciones) o el pis (la [place]). Tiene un dolor muy intenso en la espalda y: Foye Deer ganas de vomitar (nuseas) o vomita. Siente dolor en el vientre (abdomen). Le falta el aire. Se desmaya. Resumen Cuando el dolor en la espalda dura ms de 3 meses, se denomina dolor de espalda crnico. El dolor puede empeorar en ciertos  momentos (exacerbaciones). Use hielo y calor segn le indique el mdico. El mdico puede indicarle que use hielo luego del comienzo de las exacerbaciones. Esta informacin no tiene Theme park manager el consejo del mdico. Asegrese de hacerle al mdico cualquier pregunta que tenga. Document Revised: 05/26/2019 Document Reviewed: 05/26/2019 Elsevier Patient Education  2023 ArvinMeritor.

## 2022-01-30 ENCOUNTER — Other Ambulatory Visit: Payer: Self-pay | Admitting: Physician Assistant

## 2022-01-30 ENCOUNTER — Ambulatory Visit (INDEPENDENT_AMBULATORY_CARE_PROVIDER_SITE_OTHER): Payer: Medicaid Other

## 2022-01-30 ENCOUNTER — Telehealth: Payer: Self-pay

## 2022-01-30 ENCOUNTER — Ambulatory Visit
Admission: RE | Admit: 2022-01-30 | Discharge: 2022-01-30 | Disposition: A | Discharge status: Home / Self Care | Payer: Medicaid Other | Source: Ambulatory Visit | Attending: Physician Assistant | Admitting: Physician Assistant

## 2022-01-30 ENCOUNTER — Other Ambulatory Visit: Payer: Self-pay | Admitting: Diagnostic Radiology

## 2022-01-30 VITALS — BP 111/76 | HR 60 | Ht 67.0 in | Wt 190.0 lb

## 2022-01-30 DIAGNOSIS — G8929 Other chronic pain: Secondary | ICD-10-CM

## 2022-01-30 DIAGNOSIS — Z3202 Encounter for pregnancy test, result negative: Secondary | ICD-10-CM | POA: Diagnosis not present

## 2022-01-30 DIAGNOSIS — M5410 Radiculopathy, site unspecified: Secondary | ICD-10-CM

## 2022-01-30 LAB — POCT URINE PREGNANCY: Preg Test, Ur: NEGATIVE

## 2022-01-30 NOTE — Progress Notes (Addendum)
Patient was assessed and managed by nursing staff during this encounter. I have reviewed the chart and agree with the documentation and plan. I have also made any necessary editorial changes.  Savannha Welle, MD 01/30/2022 2:11 PM   

## 2022-01-30 NOTE — Progress Notes (Signed)
[name] presents today for UPT. She has no unusual complaints and Irregular periods and trying to conceive.  LMP:09/14/2021    OBJECTIVE: Appears well, in no apparent distress.  OB History     Gravida  2   Para  1   Term      Preterm      AB  1   Living  1      SAB      IAB      Ectopic      Multiple      Live Births             Home UPT Result: NEGATIVE X5 In-Office UPT result: NEGATIVE  I have reviewed the patient's medical, obstetrical, social, and family histories, and medications.   ASSESSMENT: Negative Pregnancy test  PLAN Prenatal care to be completed at: NA.  Pt made appointment with MD to discuss her missed periods and inability to conceive.

## 2022-01-30 NOTE — Telephone Encounter (Signed)
Pt will need a documented NEG Pregnant test. Pt confirmed that she hasn't had a period in 5 months.   Imaging wasn't completed today.   8-4:30 walk in for [facility] imaging hours  Pt had it completed today but will have results on Wednesday per her husband.

## 2022-02-01 ENCOUNTER — Ambulatory Visit: Payer: Medicaid Other | Admitting: Obstetrics

## 2022-02-01 ENCOUNTER — Ambulatory Visit
Admission: RE | Admit: 2022-02-01 | Discharge: 2022-02-01 | Disposition: A | Discharge status: Home / Self Care | Payer: Medicaid Other | Source: Ambulatory Visit | Attending: Nurse Practitioner | Admitting: Nurse Practitioner

## 2022-02-01 ENCOUNTER — Other Ambulatory Visit: Payer: Self-pay | Admitting: Nurse Practitioner

## 2022-02-01 ENCOUNTER — Ambulatory Visit
Admission: RE | Admit: 2022-02-01 | Discharge: 2022-02-01 | Disposition: A | Discharge status: Home / Self Care | Payer: Medicaid Other | Source: Ambulatory Visit | Attending: Physician Assistant | Admitting: Physician Assistant

## 2022-02-01 DIAGNOSIS — M545 Low back pain, unspecified: Secondary | ICD-10-CM | POA: Diagnosis not present

## 2022-02-01 DIAGNOSIS — M546 Pain in thoracic spine: Secondary | ICD-10-CM | POA: Diagnosis not present

## 2022-02-01 DIAGNOSIS — G8929 Other chronic pain: Secondary | ICD-10-CM

## 2022-02-02 ENCOUNTER — Encounter: Payer: Self-pay | Admitting: Nurse Practitioner

## 2022-02-02 NOTE — Progress Notes (Signed)
Patient cancelled appointment.  Brock Bad, MD 02/02/2022 12:11 PM

## 2022-02-06 ENCOUNTER — Ambulatory Visit: Payer: Medicaid Other | Admitting: Obstetrics and Gynecology

## 2022-02-06 VITALS — BP 116/75 | HR 66 | Wt 190.0 lb

## 2022-02-06 DIAGNOSIS — N912 Amenorrhea, unspecified: Secondary | ICD-10-CM | POA: Diagnosis not present

## 2022-02-06 DIAGNOSIS — N979 Female infertility, unspecified: Secondary | ICD-10-CM

## 2022-02-06 DIAGNOSIS — N926 Irregular menstruation, unspecified: Secondary | ICD-10-CM | POA: Diagnosis not present

## 2022-02-06 MED ORDER — METFORMIN HCL ER 500 MG PO TB24: ORAL_TABLET | ORAL | 4 refills | Status: AC

## 2022-02-06 MED ORDER — MEDROXYPROGESTERONE ACETATE 10 MG PO TABS: ORAL_TABLET | ORAL | 2 refills | Status: AC

## 2022-02-06 NOTE — Progress Notes (Signed)
Pt states she has not had a cycle since July 2023. Pt states she was taking Metformin, has stopped as she thought there were no more refills.  Pt would like to conceive.  Pt has had hx of fertility problems.

## 2022-02-06 NOTE — Progress Notes (Signed)
  CC: fertility issues Subjective:    Patient ID: Tracie Tapia, female    DOB: Jun 30, 1995, 26 y.o.   MRN: [id number]  HPI 26 yo G2P1 seen for discussion of lack of menses and desire for fertility.  She has not had a cycle since 7/23.  Pt self discontinued metformin.  She and her spouse strongly desire pregnancy.  Hbg A1c was noted to be mildly elevated at 5.8.  Spouse translated she has not had a menses even while using metformin.  Per pt she states her menses have been like this since menarche at age 25.  She has delivered one child, but that was a spontaneous ovulation without any surrounding menses.     Review of Systems     Objective:   Physical Exam Vitals:   02/06/22 1606  BP: 116/75  Pulse: 66         Assessment & Plan:   1. Irregular menses Discussed OCP for cycle control, but pt declines because she would like to be pregnant  2. Amenorrhea Will attempt provera challenge with 20 mg po daily x 10 days - metFORMIN (GLUCOPHAGE-XR) 500 MG 24 hr tablet; Take one tablet at dinner for a week, then increase to two tablets at dinner for a week then 3 tablets at dinner  Dispense: 90 tablet; Refill: 4 - medroxyPROGESTERone (PROVERA) 10 MG tablet; Take 2 tablets by mouth daily.  Use for ten days  Dispense: 20 tablet; Refill: 2  3. Infertility:  will refer to infertility for more evaluation and increased level of treatment  Pt advised to call if she does or does not have menses after provera challenge.  I spent 30 minutes dedicated to the care of this patient including previsit review of records, face to face time with the patient discussing treatment options and post visit testing.     Warden Fillers, MD Faculty Attending, [facility]

## 2022-09-04 DIAGNOSIS — K002 Abnormalities of size and form of teeth: Secondary | ICD-10-CM | POA: Diagnosis not present

## 2022-09-04 DIAGNOSIS — Z0120 Encounter for dental examination and cleaning without abnormal findings: Secondary | ICD-10-CM | POA: Diagnosis not present

## 2022-11-06 ENCOUNTER — Ambulatory Visit: Payer: Medicaid Other | Admitting: Family Medicine

## 2022-11-06 NOTE — Progress Notes (Deleted)
Established Patient Office Visit  Subjective   Patient ID: Tracie Tapia, female    DOB: 1995/06/06  Age: 27 y.o. MRN: [id number]  No chief complaint on file.   HPI  Hld - repeat   Irregular menses - refer   The ASCVD Risk score (Arnett DK, et al., 2019) failed to calculate for the following reasons:   The 2019 ASCVD risk score is only valid for ages 38 to 44  Health Maintenance Due  Topic Date Due   DTaP/Tdap/Td (1 - Tdap) Never done   INFLUENZA VACCINE  09/21/2022   COVID-19 Vaccine (1 - 2023-24 season) Never done      Objective:     There were no vitals taken for this visit. {Vitals History (Optional):23777}  Physical Exam   No results found for any visits on 11/06/22.      Assessment & Plan:   There are no diagnoses linked to this encounter.   No follow-ups on file.    Sandre Kitty, MD

## 2022-12-12 DIAGNOSIS — R202 Paresthesia of skin: Secondary | ICD-10-CM | POA: Diagnosis not present

## 2022-12-12 DIAGNOSIS — Z6833 Body mass index (BMI) 33.0-33.9, adult: Secondary | ICD-10-CM | POA: Diagnosis not present

## 2022-12-12 DIAGNOSIS — E66811 Obesity, class 1: Secondary | ICD-10-CM | POA: Diagnosis not present

## 2022-12-12 DIAGNOSIS — R159 Full incontinence of feces: Secondary | ICD-10-CM | POA: Diagnosis not present

## 2022-12-12 DIAGNOSIS — R1114 Bilious vomiting: Secondary | ICD-10-CM | POA: Diagnosis not present

## 2022-12-12 DIAGNOSIS — N915 Oligomenorrhea, unspecified: Secondary | ICD-10-CM | POA: Diagnosis not present

## 2022-12-12 DIAGNOSIS — R152 Fecal urgency: Secondary | ICD-10-CM | POA: Diagnosis not present

## 2022-12-12 DIAGNOSIS — R7303 Prediabetes: Secondary | ICD-10-CM | POA: Diagnosis not present

## 2022-12-12 DIAGNOSIS — E6609 Other obesity due to excess calories: Secondary | ICD-10-CM | POA: Diagnosis not present

## 2022-12-12 DIAGNOSIS — R1011 Right upper quadrant pain: Secondary | ICD-10-CM | POA: Diagnosis not present

## 2022-12-12 DIAGNOSIS — E041 Nontoxic single thyroid nodule: Secondary | ICD-10-CM | POA: Diagnosis not present

## 2022-12-13 DIAGNOSIS — E041 Nontoxic single thyroid nodule: Secondary | ICD-10-CM | POA: Diagnosis not present

## 2022-12-13 DIAGNOSIS — K8020 Calculus of gallbladder without cholecystitis without obstruction: Secondary | ICD-10-CM | POA: Diagnosis not present

## 2022-12-13 DIAGNOSIS — R160 Hepatomegaly, not elsewhere classified: Secondary | ICD-10-CM | POA: Diagnosis not present

## 2022-12-13 DIAGNOSIS — E042 Nontoxic multinodular goiter: Secondary | ICD-10-CM | POA: Diagnosis not present

## 2022-12-20 DIAGNOSIS — K8020 Calculus of gallbladder without cholecystitis without obstruction: Secondary | ICD-10-CM | POA: Diagnosis not present

## 2022-12-27 DIAGNOSIS — K8020 Calculus of gallbladder without cholecystitis without obstruction: Secondary | ICD-10-CM | POA: Diagnosis not present

## 2022-12-27 DIAGNOSIS — E6609 Other obesity due to excess calories: Secondary | ICD-10-CM | POA: Diagnosis not present

## 2022-12-27 DIAGNOSIS — M5442 Lumbago with sciatica, left side: Secondary | ICD-10-CM | POA: Diagnosis not present

## 2022-12-27 DIAGNOSIS — E66811 Obesity, class 1: Secondary | ICD-10-CM | POA: Diagnosis not present

## 2022-12-27 DIAGNOSIS — Z6834 Body mass index (BMI) 34.0-34.9, adult: Secondary | ICD-10-CM | POA: Diagnosis not present

## 2022-12-27 DIAGNOSIS — J0110 Acute frontal sinusitis, unspecified: Secondary | ICD-10-CM | POA: Diagnosis not present

## 2022-12-27 DIAGNOSIS — R7303 Prediabetes: Secondary | ICD-10-CM | POA: Diagnosis not present

## 2022-12-27 DIAGNOSIS — M5441 Lumbago with sciatica, right side: Secondary | ICD-10-CM | POA: Diagnosis not present

## 2022-12-27 DIAGNOSIS — K760 Fatty (change of) liver, not elsewhere classified: Secondary | ICD-10-CM | POA: Diagnosis not present

## 2022-12-27 DIAGNOSIS — N915 Oligomenorrhea, unspecified: Secondary | ICD-10-CM | POA: Diagnosis not present

## 2022-12-27 DIAGNOSIS — G8929 Other chronic pain: Secondary | ICD-10-CM | POA: Diagnosis not present

## 2023-01-05 DIAGNOSIS — E119 Type 2 diabetes mellitus without complications: Secondary | ICD-10-CM | POA: Diagnosis not present

## 2023-01-05 DIAGNOSIS — E669 Obesity, unspecified: Secondary | ICD-10-CM | POA: Diagnosis not present

## 2023-01-05 DIAGNOSIS — K8010 Calculus of gallbladder with chronic cholecystitis without obstruction: Secondary | ICD-10-CM | POA: Diagnosis not present

## 2023-01-05 DIAGNOSIS — J45909 Unspecified asthma, uncomplicated: Secondary | ICD-10-CM | POA: Diagnosis not present

## 2023-01-05 DIAGNOSIS — K8020 Calculus of gallbladder without cholecystitis without obstruction: Secondary | ICD-10-CM | POA: Diagnosis not present

## 2023-01-05 DIAGNOSIS — K8070 Calculus of gallbladder and bile duct without cholecystitis without obstruction: Secondary | ICD-10-CM | POA: Diagnosis not present

## 2023-01-08 DIAGNOSIS — K8010 Calculus of gallbladder with chronic cholecystitis without obstruction: Secondary | ICD-10-CM | POA: Diagnosis not present

## 2023-02-11 IMAGING — US US ABDOMEN LIMITED
1 series · 14 of 25 positions shown · non-contrast
Comparison: 04/18/2021.

CLINICAL DATA: Right upper quadrant pain.

EXAM:
ULTRASOUND ABDOMEN LIMITED RIGHT UPPER QUADRANT

[Series 1: us abdomen limited · 14 of 58 slices shown]
[im 1/58]
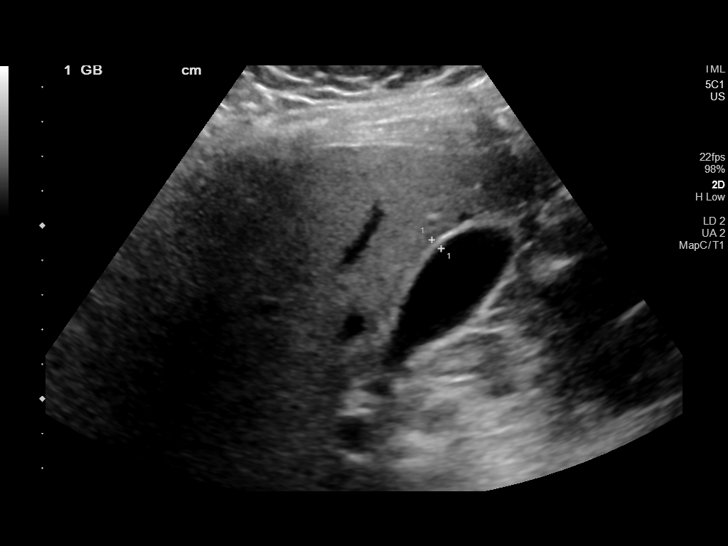
[im 5/58]
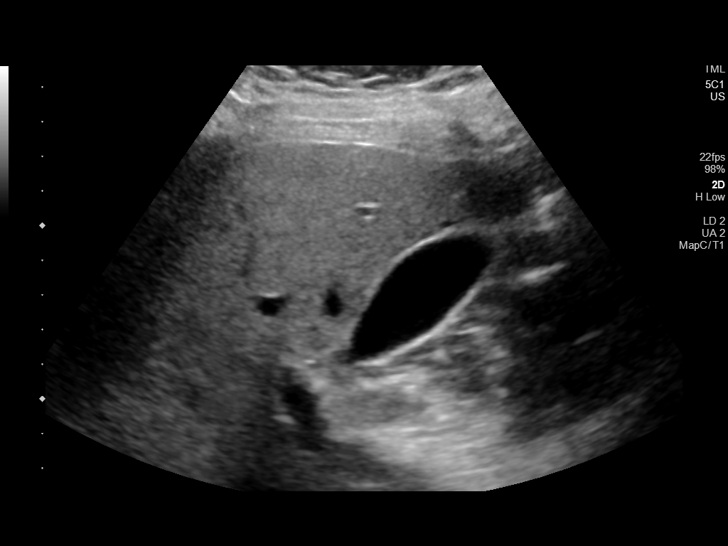
[im 10/58]
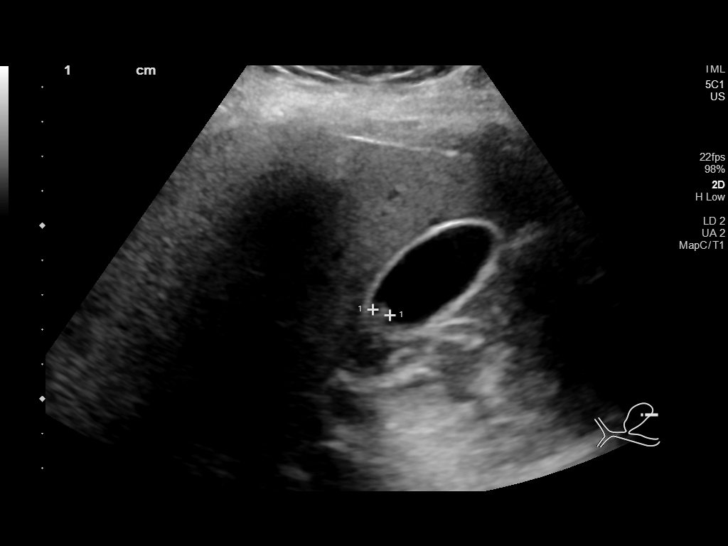
[im 15/58]
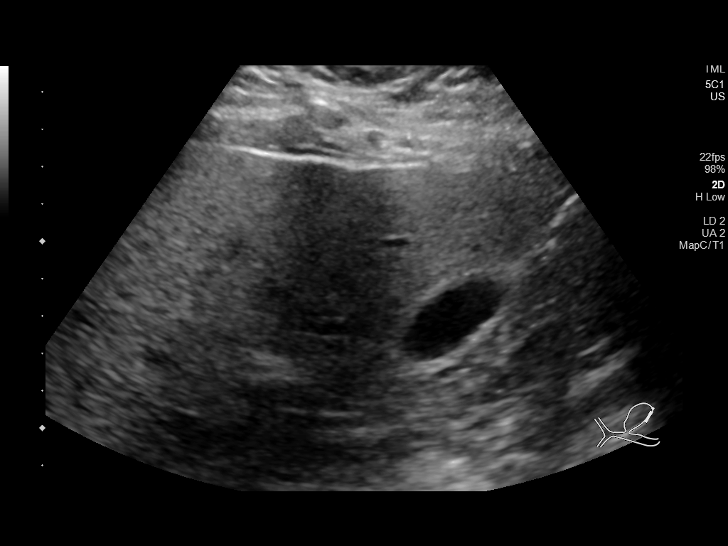
[im 20/58]
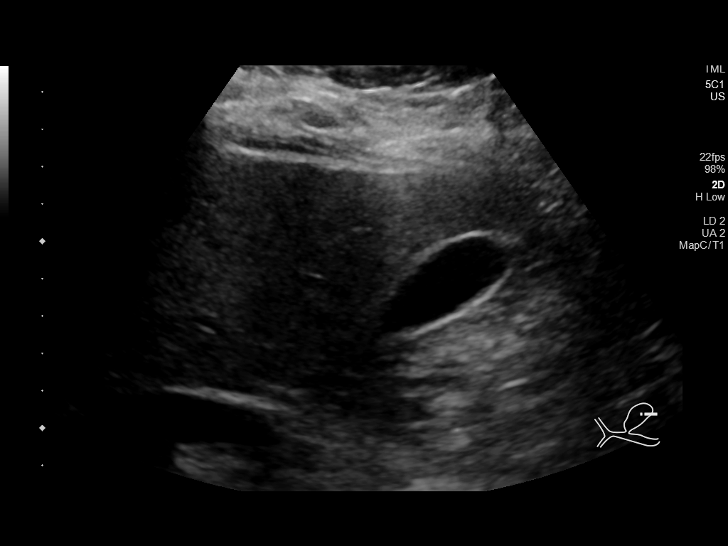
[im 22/58]
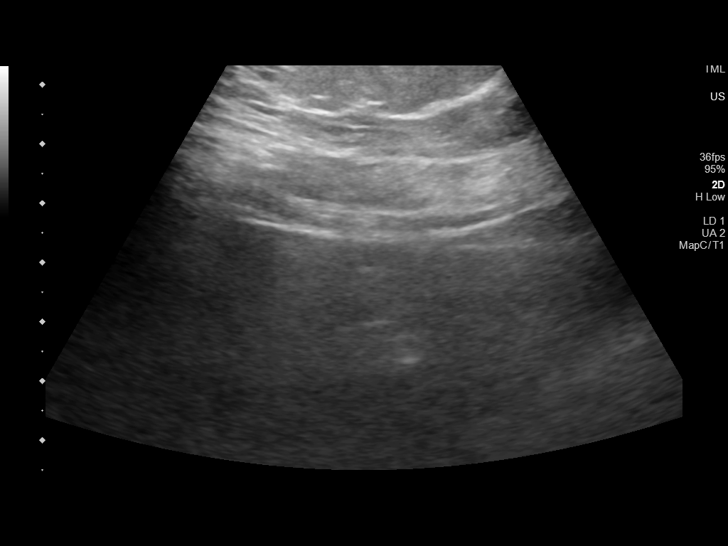
[im 27/58]
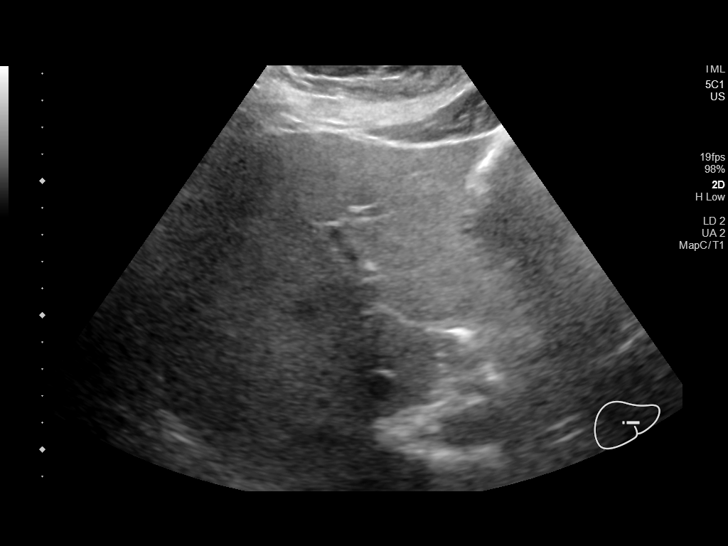
[im 31/58]
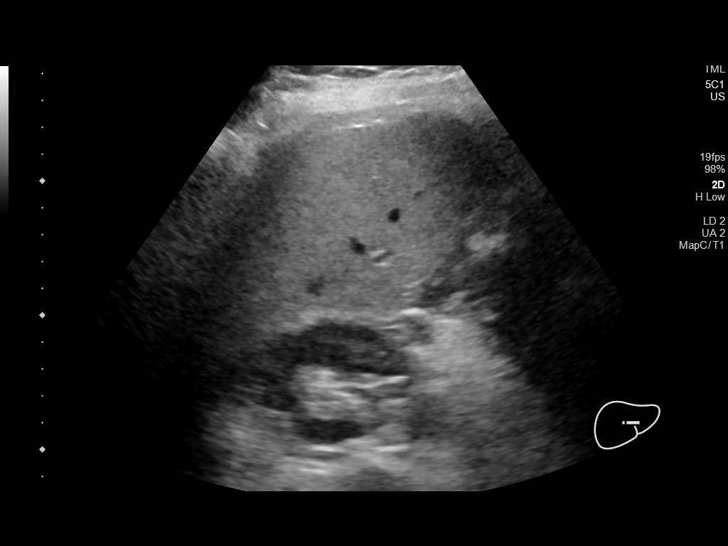
[im 36/58]
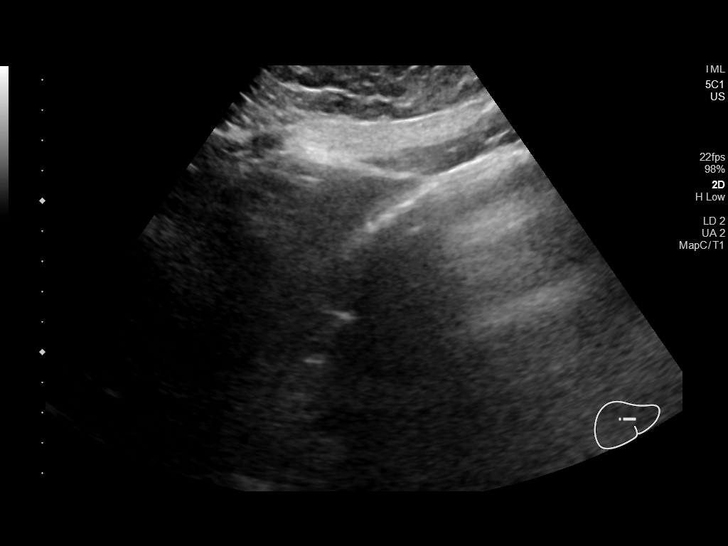
[im 39/58]
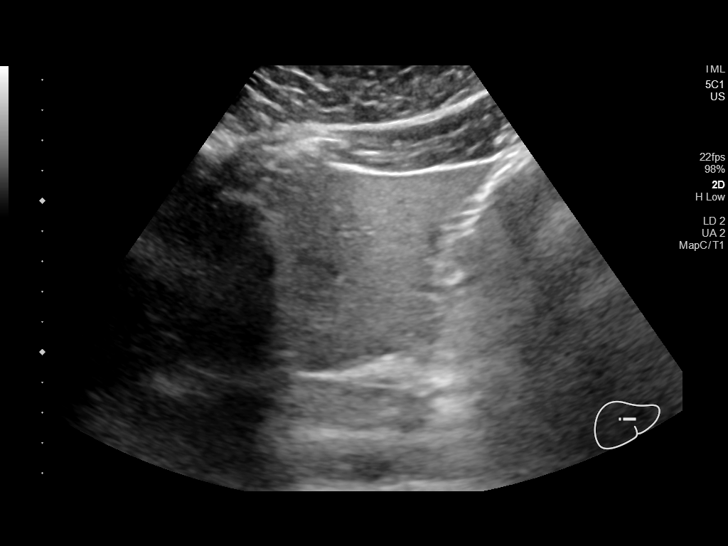
[im 43/58]
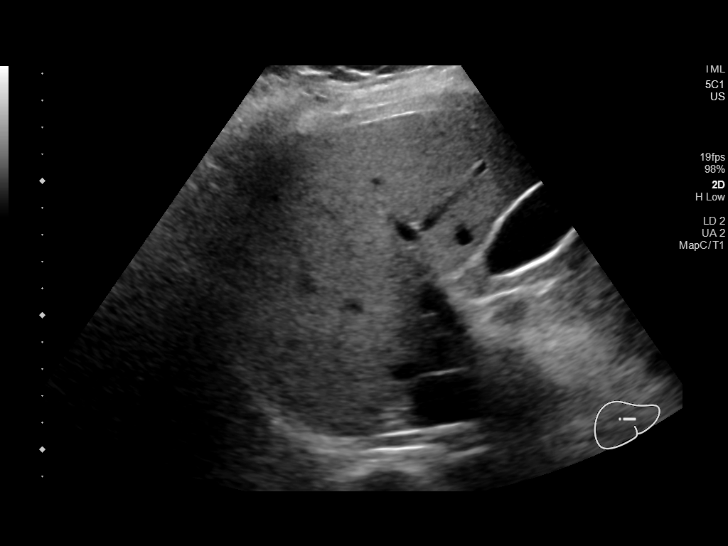
[im 48/58]
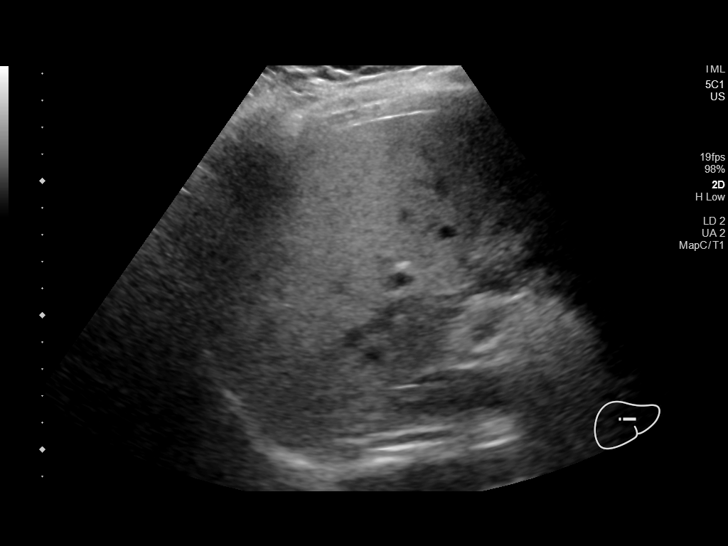
[im 53/58]
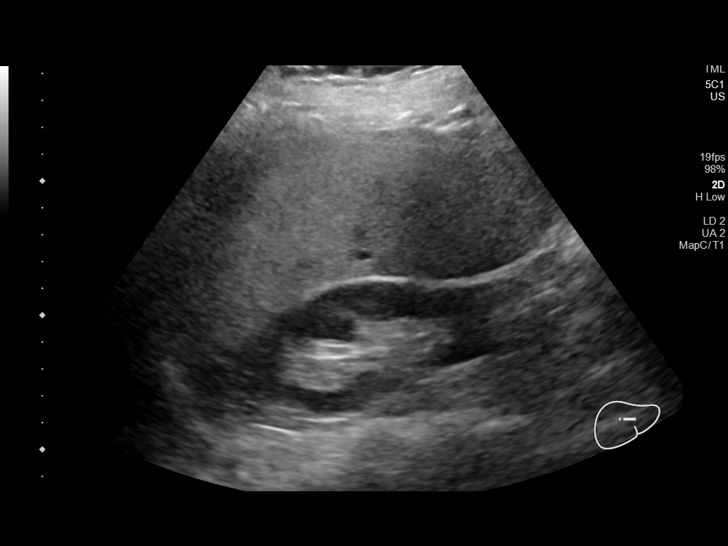
[im 58/58]
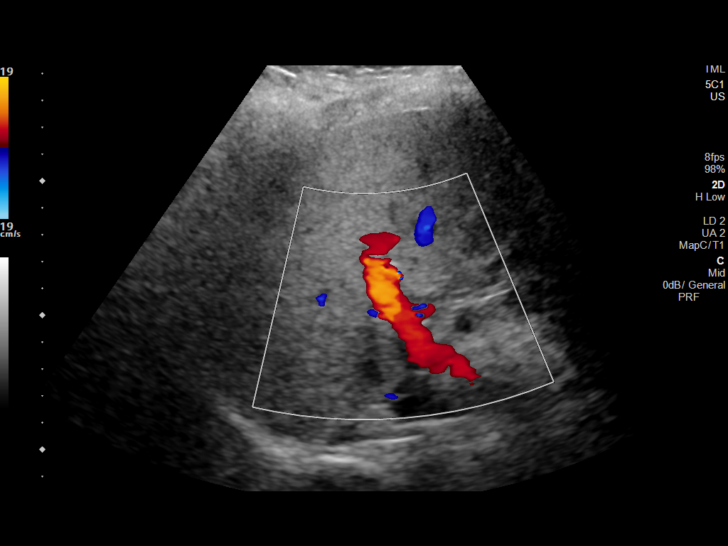

[14 of 25 positions shown; findings below may reference images not displayed]

FINDINGS: Gallbladder:

A nonshadowing echogenic foci is is present in the gallbladder
measuring 5 mm. The gallbladder wall measures 3 mm which is within
normal limits. No pericholecystic fluid. No sonographic Murphy sign
noted by sonographer.

Common bile duct:

Diameter: 4.2 mm.

Liver:

No focal lesion identified. Increased parenchymal echogenicity.
Portal vein is patent on color Doppler imaging with normal direction
of blood flow towards the liver.

Other: No free fluid.
IMPRESSION: 1. No cholelithiasis or acute cholecystitis.
2. Nonshadowing echogenic focus in the gallbladder measuring 5 mm,
possible sludge ball or polyp.
3. Hepatic steatosis.

## 2023-03-22 DIAGNOSIS — M5442 Lumbago with sciatica, left side: Secondary | ICD-10-CM | POA: Diagnosis not present

## 2023-03-22 DIAGNOSIS — G8929 Other chronic pain: Secondary | ICD-10-CM | POA: Diagnosis not present

## 2023-03-22 DIAGNOSIS — M5441 Lumbago with sciatica, right side: Secondary | ICD-10-CM | POA: Diagnosis not present

## 2023-04-06 DIAGNOSIS — M5136 Other intervertebral disc degeneration, lumbar region with discogenic back pain only: Secondary | ICD-10-CM | POA: Diagnosis not present

## 2023-04-06 DIAGNOSIS — M47816 Spondylosis without myelopathy or radiculopathy, lumbar region: Secondary | ICD-10-CM | POA: Diagnosis not present

## 2023-04-06 DIAGNOSIS — M47817 Spondylosis without myelopathy or radiculopathy, lumbosacral region: Secondary | ICD-10-CM | POA: Diagnosis not present

## 2023-04-09 DIAGNOSIS — G8929 Other chronic pain: Secondary | ICD-10-CM | POA: Diagnosis not present

## 2023-04-09 DIAGNOSIS — R202 Paresthesia of skin: Secondary | ICD-10-CM | POA: Diagnosis not present

## 2023-04-09 DIAGNOSIS — M5442 Lumbago with sciatica, left side: Secondary | ICD-10-CM | POA: Diagnosis not present

## 2023-04-09 DIAGNOSIS — R7303 Prediabetes: Secondary | ICD-10-CM | POA: Diagnosis not present

## 2023-04-09 DIAGNOSIS — R1011 Right upper quadrant pain: Secondary | ICD-10-CM | POA: Diagnosis not present

## 2023-04-09 DIAGNOSIS — M5441 Lumbago with sciatica, right side: Secondary | ICD-10-CM | POA: Diagnosis not present

## 2023-04-16 DIAGNOSIS — M5441 Lumbago with sciatica, right side: Secondary | ICD-10-CM | POA: Diagnosis not present

## 2023-04-16 DIAGNOSIS — G8929 Other chronic pain: Secondary | ICD-10-CM | POA: Diagnosis not present

## 2023-04-16 DIAGNOSIS — M5442 Lumbago with sciatica, left side: Secondary | ICD-10-CM | POA: Diagnosis not present

## 2023-04-25 DIAGNOSIS — R202 Paresthesia of skin: Secondary | ICD-10-CM | POA: Diagnosis not present

## 2023-04-25 DIAGNOSIS — M47816 Spondylosis without myelopathy or radiculopathy, lumbar region: Secondary | ICD-10-CM | POA: Diagnosis not present

## 2023-04-25 DIAGNOSIS — M5442 Lumbago with sciatica, left side: Secondary | ICD-10-CM | POA: Diagnosis not present

## 2023-04-25 DIAGNOSIS — M5441 Lumbago with sciatica, right side: Secondary | ICD-10-CM | POA: Diagnosis not present

## 2023-04-25 DIAGNOSIS — R200 Anesthesia of skin: Secondary | ICD-10-CM | POA: Diagnosis not present

## 2023-04-25 DIAGNOSIS — G8929 Other chronic pain: Secondary | ICD-10-CM | POA: Diagnosis not present

## 2023-04-30 DIAGNOSIS — K029 Dental caries, unspecified: Secondary | ICD-10-CM | POA: Diagnosis not present

## 2023-05-04 DIAGNOSIS — M4856XA Collapsed vertebra, not elsewhere classified, lumbar region, initial encounter for fracture: Secondary | ICD-10-CM | POA: Diagnosis not present

## 2023-05-04 DIAGNOSIS — M47815 Spondylosis without myelopathy or radiculopathy, thoracolumbar region: Secondary | ICD-10-CM | POA: Diagnosis not present

## 2023-05-04 DIAGNOSIS — M5127 Other intervertebral disc displacement, lumbosacral region: Secondary | ICD-10-CM | POA: Diagnosis not present

## 2023-05-04 DIAGNOSIS — M47816 Spondylosis without myelopathy or radiculopathy, lumbar region: Secondary | ICD-10-CM | POA: Diagnosis not present

## 2023-05-04 DIAGNOSIS — M5125 Other intervertebral disc displacement, thoracolumbar region: Secondary | ICD-10-CM | POA: Diagnosis not present

## 2023-05-22 DIAGNOSIS — N915 Oligomenorrhea, unspecified: Secondary | ICD-10-CM | POA: Diagnosis not present

## 2023-05-22 DIAGNOSIS — Z319 Encounter for procreative management, unspecified: Secondary | ICD-10-CM | POA: Diagnosis not present

## 2023-05-22 DIAGNOSIS — Z3189 Encounter for other procreative management: Secondary | ICD-10-CM | POA: Diagnosis not present

## 2023-05-22 DIAGNOSIS — R7303 Prediabetes: Secondary | ICD-10-CM | POA: Diagnosis not present

## 2023-05-25 DIAGNOSIS — R200 Anesthesia of skin: Secondary | ICD-10-CM | POA: Diagnosis not present

## 2023-05-25 DIAGNOSIS — R202 Paresthesia of skin: Secondary | ICD-10-CM | POA: Diagnosis not present

## 2023-05-25 DIAGNOSIS — M47816 Spondylosis without myelopathy or radiculopathy, lumbar region: Secondary | ICD-10-CM | POA: Diagnosis not present

## 2023-05-25 DIAGNOSIS — M5442 Lumbago with sciatica, left side: Secondary | ICD-10-CM | POA: Diagnosis not present

## 2023-05-25 DIAGNOSIS — G8929 Other chronic pain: Secondary | ICD-10-CM | POA: Diagnosis not present

## 2023-05-25 DIAGNOSIS — M5441 Lumbago with sciatica, right side: Secondary | ICD-10-CM | POA: Diagnosis not present

## 2023-06-22 DIAGNOSIS — G8929 Other chronic pain: Secondary | ICD-10-CM | POA: Diagnosis not present

## 2023-06-22 DIAGNOSIS — R200 Anesthesia of skin: Secondary | ICD-10-CM | POA: Diagnosis not present

## 2023-06-22 DIAGNOSIS — M47816 Spondylosis without myelopathy or radiculopathy, lumbar region: Secondary | ICD-10-CM | POA: Diagnosis not present

## 2023-06-22 DIAGNOSIS — R202 Paresthesia of skin: Secondary | ICD-10-CM | POA: Diagnosis not present

## 2023-06-22 DIAGNOSIS — M5441 Lumbago with sciatica, right side: Secondary | ICD-10-CM | POA: Diagnosis not present

## 2023-06-22 DIAGNOSIS — M5442 Lumbago with sciatica, left side: Secondary | ICD-10-CM | POA: Diagnosis not present

## 2023-06-29 DIAGNOSIS — Z6833 Body mass index (BMI) 33.0-33.9, adult: Secondary | ICD-10-CM | POA: Diagnosis not present

## 2023-06-29 DIAGNOSIS — N915 Oligomenorrhea, unspecified: Secondary | ICD-10-CM | POA: Diagnosis not present

## 2023-06-29 DIAGNOSIS — N970 Female infertility associated with anovulation: Secondary | ICD-10-CM | POA: Diagnosis not present

## 2023-06-29 DIAGNOSIS — R7303 Prediabetes: Secondary | ICD-10-CM | POA: Diagnosis not present

## 2023-08-13 DIAGNOSIS — N926 Irregular menstruation, unspecified: Secondary | ICD-10-CM | POA: Diagnosis not present

## 2023-08-13 DIAGNOSIS — N979 Female infertility, unspecified: Secondary | ICD-10-CM | POA: Diagnosis not present

## 2023-08-14 DIAGNOSIS — G8929 Other chronic pain: Secondary | ICD-10-CM | POA: Diagnosis not present

## 2023-08-14 DIAGNOSIS — M5442 Lumbago with sciatica, left side: Secondary | ICD-10-CM | POA: Diagnosis not present

## 2023-08-14 DIAGNOSIS — M5441 Lumbago with sciatica, right side: Secondary | ICD-10-CM | POA: Diagnosis not present

## 2023-08-14 DIAGNOSIS — M533 Sacrococcygeal disorders, not elsewhere classified: Secondary | ICD-10-CM | POA: Diagnosis not present

## 2023-08-23 DIAGNOSIS — R519 Headache, unspecified: Secondary | ICD-10-CM | POA: Diagnosis not present

## 2023-08-23 DIAGNOSIS — R1110 Vomiting, unspecified: Secondary | ICD-10-CM | POA: Diagnosis not present

## 2023-08-23 DIAGNOSIS — Z3202 Encounter for pregnancy test, result negative: Secondary | ICD-10-CM | POA: Diagnosis not present

## 2023-08-23 DIAGNOSIS — R197 Diarrhea, unspecified: Secondary | ICD-10-CM | POA: Diagnosis not present

## 2023-08-28 DIAGNOSIS — Z3189 Encounter for other procreative management: Secondary | ICD-10-CM | POA: Diagnosis not present

## 2023-08-28 DIAGNOSIS — N979 Female infertility, unspecified: Secondary | ICD-10-CM | POA: Diagnosis not present

## 2023-08-28 DIAGNOSIS — N970 Female infertility associated with anovulation: Secondary | ICD-10-CM | POA: Diagnosis not present

## 2023-08-28 DIAGNOSIS — N915 Oligomenorrhea, unspecified: Secondary | ICD-10-CM | POA: Diagnosis not present

## 2023-09-05 DIAGNOSIS — K0490 Unspecified diseases of pulp and periapical tissues: Secondary | ICD-10-CM | POA: Diagnosis not present

## 2023-09-09 DIAGNOSIS — R112 Nausea with vomiting, unspecified: Secondary | ICD-10-CM | POA: Diagnosis not present

## 2023-09-09 DIAGNOSIS — R059 Cough, unspecified: Secondary | ICD-10-CM | POA: Diagnosis not present

## 2023-09-09 DIAGNOSIS — J45909 Unspecified asthma, uncomplicated: Secondary | ICD-10-CM | POA: Diagnosis not present

## 2023-09-09 DIAGNOSIS — R0981 Nasal congestion: Secondary | ICD-10-CM | POA: Diagnosis not present

## 2023-09-14 DIAGNOSIS — R202 Paresthesia of skin: Secondary | ICD-10-CM | POA: Diagnosis not present

## 2023-09-14 DIAGNOSIS — G8929 Other chronic pain: Secondary | ICD-10-CM | POA: Diagnosis not present

## 2023-09-14 DIAGNOSIS — M5441 Lumbago with sciatica, right side: Secondary | ICD-10-CM | POA: Diagnosis not present

## 2023-09-14 DIAGNOSIS — M5442 Lumbago with sciatica, left side: Secondary | ICD-10-CM | POA: Diagnosis not present

## 2023-09-14 DIAGNOSIS — R200 Anesthesia of skin: Secondary | ICD-10-CM | POA: Diagnosis not present

## 2023-09-17 DIAGNOSIS — N979 Female infertility, unspecified: Secondary | ICD-10-CM | POA: Diagnosis not present

## 2023-09-17 DIAGNOSIS — N926 Irregular menstruation, unspecified: Secondary | ICD-10-CM | POA: Diagnosis not present

## 2023-09-17 DIAGNOSIS — Z319 Encounter for procreative management, unspecified: Secondary | ICD-10-CM | POA: Diagnosis not present

## 2023-10-17 DIAGNOSIS — R202 Paresthesia of skin: Secondary | ICD-10-CM | POA: Diagnosis not present

## 2023-10-17 DIAGNOSIS — M533 Sacrococcygeal disorders, not elsewhere classified: Secondary | ICD-10-CM | POA: Diagnosis not present

## 2023-10-17 DIAGNOSIS — M5442 Lumbago with sciatica, left side: Secondary | ICD-10-CM | POA: Diagnosis not present

## 2023-10-17 DIAGNOSIS — G8929 Other chronic pain: Secondary | ICD-10-CM | POA: Diagnosis not present

## 2023-10-17 DIAGNOSIS — M47816 Spondylosis without myelopathy or radiculopathy, lumbar region: Secondary | ICD-10-CM | POA: Diagnosis not present

## 2023-10-17 DIAGNOSIS — R200 Anesthesia of skin: Secondary | ICD-10-CM | POA: Diagnosis not present

## 2023-10-17 DIAGNOSIS — M5441 Lumbago with sciatica, right side: Secondary | ICD-10-CM | POA: Diagnosis not present

## 2023-10-23 DIAGNOSIS — Z319 Encounter for procreative management, unspecified: Secondary | ICD-10-CM | POA: Diagnosis not present

## 2023-10-23 DIAGNOSIS — N979 Female infertility, unspecified: Secondary | ICD-10-CM | POA: Diagnosis not present

## 2023-10-23 DIAGNOSIS — N970 Female infertility associated with anovulation: Secondary | ICD-10-CM | POA: Diagnosis not present

## 2023-12-17 DIAGNOSIS — K029 Dental caries, unspecified: Secondary | ICD-10-CM | POA: Diagnosis not present

## 2023-12-19 DIAGNOSIS — N926 Irregular menstruation, unspecified: Secondary | ICD-10-CM | POA: Diagnosis not present

## 2023-12-19 DIAGNOSIS — N915 Oligomenorrhea, unspecified: Secondary | ICD-10-CM | POA: Diagnosis not present

## 2023-12-19 DIAGNOSIS — R7303 Prediabetes: Secondary | ICD-10-CM | POA: Diagnosis not present

## 2023-12-19 DIAGNOSIS — Z3189 Encounter for other procreative management: Secondary | ICD-10-CM | POA: Diagnosis not present

## 2023-12-19 DIAGNOSIS — N979 Female infertility, unspecified: Secondary | ICD-10-CM | POA: Diagnosis not present

## 2023-12-19 DIAGNOSIS — N970 Female infertility associated with anovulation: Secondary | ICD-10-CM | POA: Diagnosis not present

## 2023-12-25 DIAGNOSIS — M533 Sacrococcygeal disorders, not elsewhere classified: Secondary | ICD-10-CM | POA: Diagnosis not present

## 2024-01-11 DIAGNOSIS — N979 Female infertility, unspecified: Secondary | ICD-10-CM | POA: Diagnosis not present

## 2024-01-11 DIAGNOSIS — N970 Female infertility associated with anovulation: Secondary | ICD-10-CM | POA: Diagnosis not present

## 2024-01-11 DIAGNOSIS — N926 Irregular menstruation, unspecified: Secondary | ICD-10-CM | POA: Diagnosis not present

## 2024-01-11 DIAGNOSIS — N915 Oligomenorrhea, unspecified: Secondary | ICD-10-CM | POA: Diagnosis not present
